# Patient Record
Sex: Female | Born: 1945
Health system: Southern US, Community
[De-identification: ages and names within clinical notes are randomized; demographics above are authoritative.]

## PROBLEM LIST (undated history)

## (undated) DIAGNOSIS — R001 Bradycardia, unspecified: Secondary | ICD-10-CM

## (undated) DIAGNOSIS — R002 Palpitations: Secondary | ICD-10-CM

## (undated) DIAGNOSIS — M1990 Unspecified osteoarthritis, unspecified site: Secondary | ICD-10-CM

## (undated) DIAGNOSIS — K219 Gastro-esophageal reflux disease without esophagitis: Secondary | ICD-10-CM

## (undated) DIAGNOSIS — E785 Hyperlipidemia, unspecified: Secondary | ICD-10-CM

## (undated) DIAGNOSIS — K449 Diaphragmatic hernia without obstruction or gangrene: Secondary | ICD-10-CM

## (undated) DIAGNOSIS — N83202 Unspecified ovarian cyst, left side: Secondary | ICD-10-CM

## (undated) DIAGNOSIS — R7303 Prediabetes: Secondary | ICD-10-CM

## (undated) DIAGNOSIS — R61 Generalized hyperhidrosis: Secondary | ICD-10-CM

## (undated) DIAGNOSIS — M2550 Pain in unspecified joint: Secondary | ICD-10-CM

## (undated) HISTORY — DX: Prediabetes: R73.03

## (undated) HISTORY — DX: Palpitations: R00.2

## (undated) HISTORY — DX: Pain in unspecified joint: M25.50

## (undated) HISTORY — DX: Unspecified osteoarthritis, unspecified site: M19.90

## (undated) HISTORY — DX: Generalized hyperhidrosis: R61

## (undated) HISTORY — DX: Diaphragmatic hernia without obstruction or gangrene: K44.9

## (undated) HISTORY — PX: ABDOMINAL HYSTERECTOMY: SHX81

## (undated) HISTORY — DX: Unspecified ovarian cyst, left side: N83.202

## (undated) HISTORY — DX: Bradycardia, unspecified: R00.1

## (undated) HISTORY — PX: COSMETIC SURGERY: SHX468

## (undated) HISTORY — DX: Hyperlipidemia, unspecified: E78.5

## (undated) HISTORY — DX: Gastro-esophageal reflux disease without esophagitis: K21.9

---

## 2000-07-26 ENCOUNTER — Other Ambulatory Visit: Admission: RE | Admit: 2000-07-26 | Discharge: 2000-07-26 | Payer: Self-pay | Admitting: Obstetrics and Gynecology

## 2001-08-29 ENCOUNTER — Other Ambulatory Visit: Admission: RE | Admit: 2001-08-29 | Discharge: 2001-08-29 | Payer: Self-pay | Admitting: Obstetrics and Gynecology

## 2002-01-23 ENCOUNTER — Ambulatory Visit (HOSPITAL_COMMUNITY): Admission: RE | Admit: 2002-01-23 | Discharge: 2002-01-23 | Payer: Self-pay | Admitting: Internal Medicine

## 2002-08-21 ENCOUNTER — Other Ambulatory Visit: Admission: RE | Admit: 2002-08-21 | Discharge: 2002-08-21 | Payer: Self-pay | Admitting: Obstetrics and Gynecology

## 2003-08-27 ENCOUNTER — Other Ambulatory Visit: Admission: RE | Admit: 2003-08-27 | Discharge: 2003-08-27 | Payer: Self-pay | Admitting: Obstetrics and Gynecology

## 2005-10-26 ENCOUNTER — Other Ambulatory Visit: Admission: RE | Admit: 2005-10-26 | Discharge: 2005-10-26 | Payer: Self-pay | Admitting: Obstetrics and Gynecology

## 2006-09-10 ENCOUNTER — Encounter: Admission: RE | Admit: 2006-09-10 | Discharge: 2006-09-10 | Payer: Self-pay | Admitting: Family Medicine

## 2006-10-11 ENCOUNTER — Encounter: Admission: RE | Admit: 2006-10-11 | Discharge: 2006-10-11 | Payer: Self-pay | Admitting: Family Medicine

## 2006-11-08 ENCOUNTER — Encounter: Admission: RE | Admit: 2006-11-08 | Discharge: 2006-11-08 | Payer: Self-pay | Admitting: Family Medicine

## 2006-12-27 ENCOUNTER — Encounter: Admission: RE | Admit: 2006-12-27 | Discharge: 2006-12-27 | Payer: Self-pay | Admitting: Family Medicine

## 2007-04-27 IMAGING — CT CT BIOPSY
1 of 3 series · 14 of 30 positions shown, 19 images · non-contrast
Comparison: none

CLINICAL DATA: 59-year-old female; status-post right L4-5 and L5-S1 facet injections 10/11/06.  The patient states she had approximately 30% relief of pain with those injections.  She returns now for right piriformis muscle injection. 
CT BIOPSY:
Operator:  Abran Szeto, M.D.
Procedure:  After obtaining informed written consent the patient was brought to the CT suite.  She was placed on the table in the prone position.  Pelvis was scanned and the right piriformis muscle was localized.  The skin was marked and then prepped and draped in the usual sterile fashion.  The superficial soft tissues were anesthetized with 1% Lidocaine.  A 22 gauge spinal needle was then directed towards the right piriformis muscle.  CT imaging confirmed appropriate trajectory.  The needle was advanced the final 2.5 cm into the piriformis muscle.  CT imaging confirmed placement within the muscle.  
The muscle was then injected with 80 mg of Depo-Medrol and 2 cc of 1% Lidocaine.  The patient did not describe any significant pain during the injection.  No immediate complications were present.  The patient was discharged in stable neurological condition.

[Series 2: routine pelvis · axial · 0.78mm/px · z∈[-10,+85]mm · 14 of 22 slices shown, 19 images]
[im 2/22  soft-tissue]
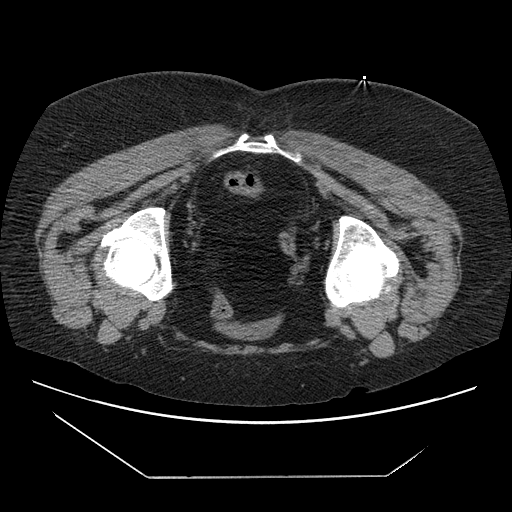
[im 2/22  bone]
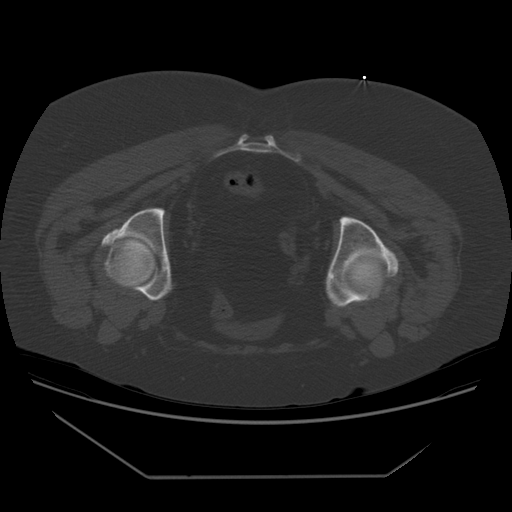
[im 4/22  soft-tissue]
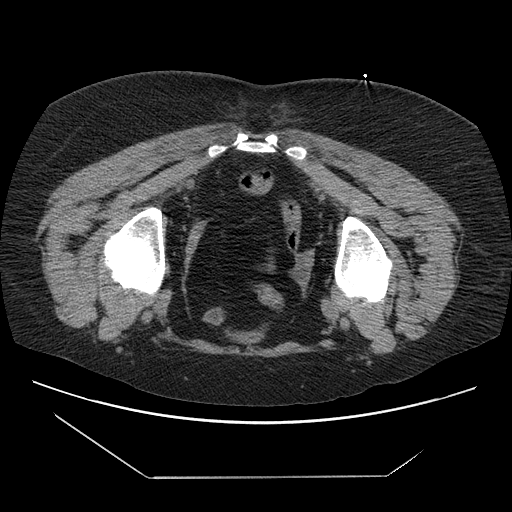
[im 5/22  soft-tissue]
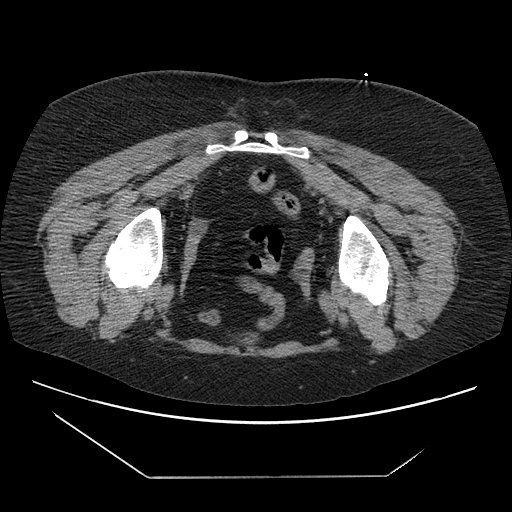
[im 7/22  soft-tissue]
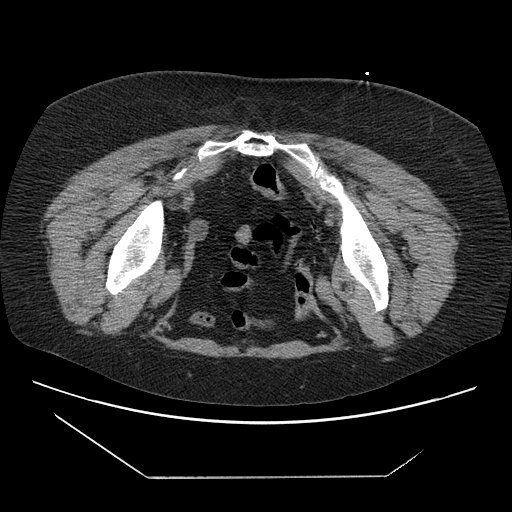
[im 8/22  soft-tissue]
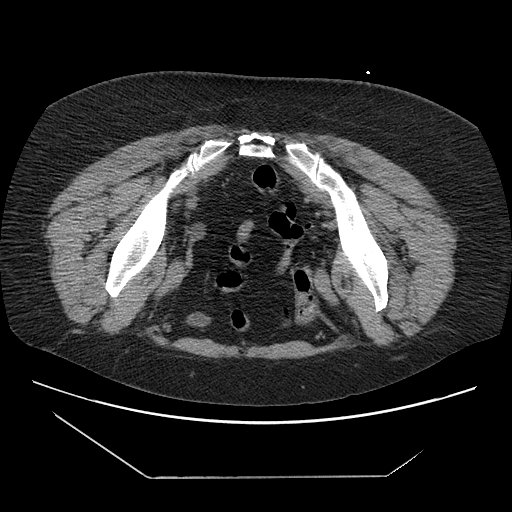
[im 10/22  soft-tissue]
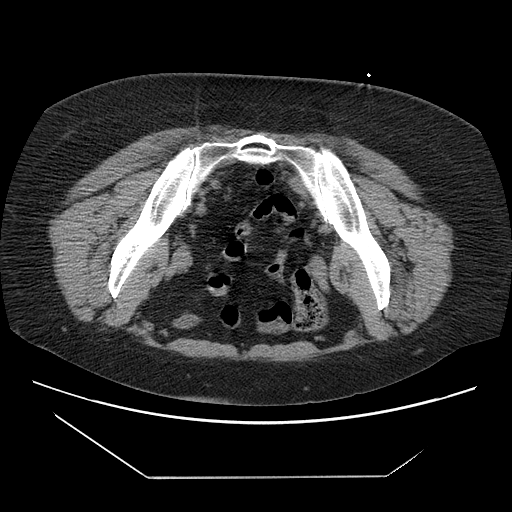
[im 12/22  soft-tissue]
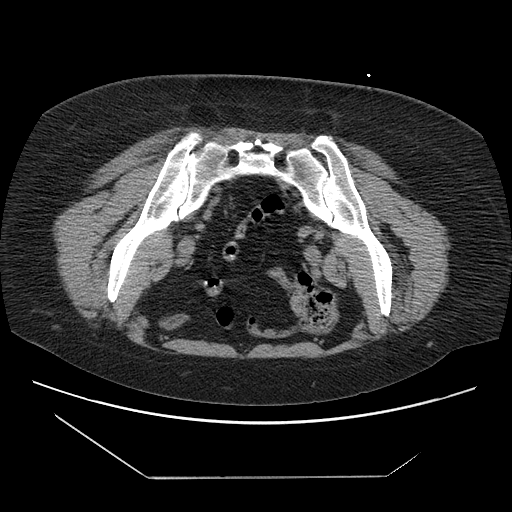
[im 13/22  soft-tissue]
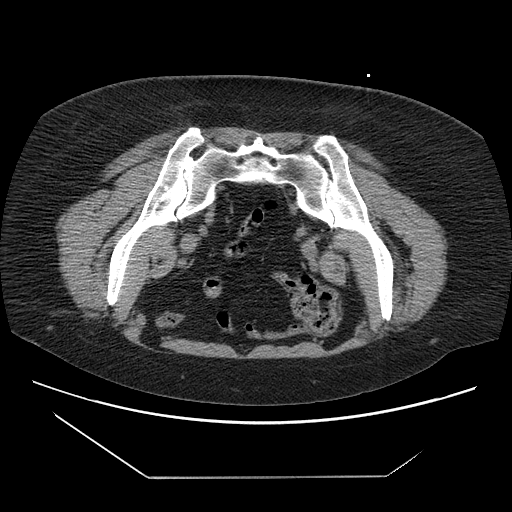
[im 15/22  soft-tissue]
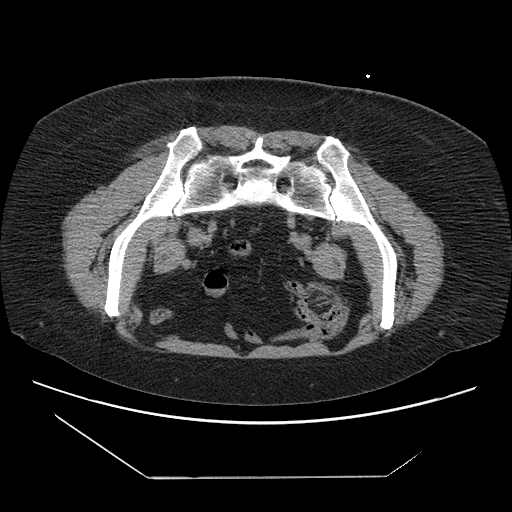
[im 15/22  bone]
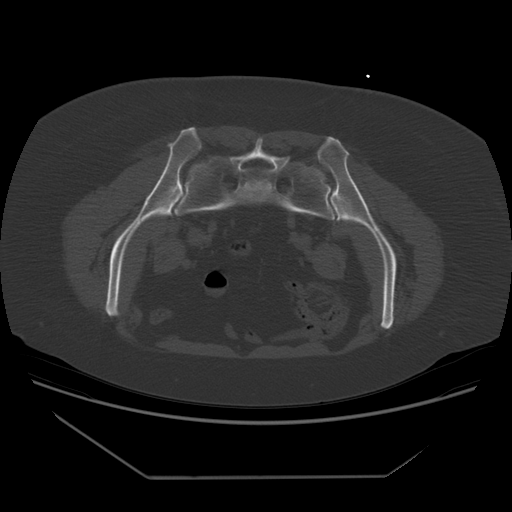
[im 16/22  soft-tissue]
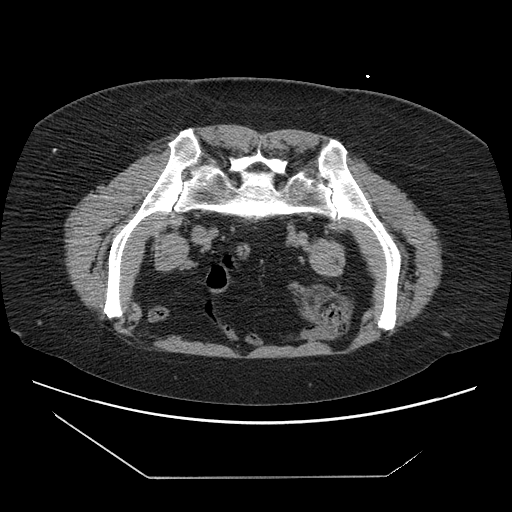
[im 18/22  soft-tissue]
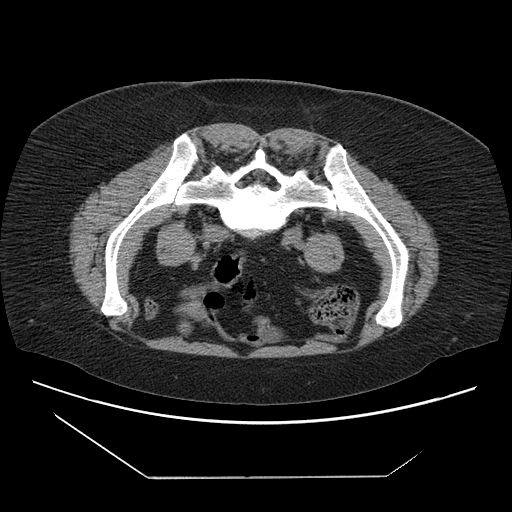
[im 18/22  lung]
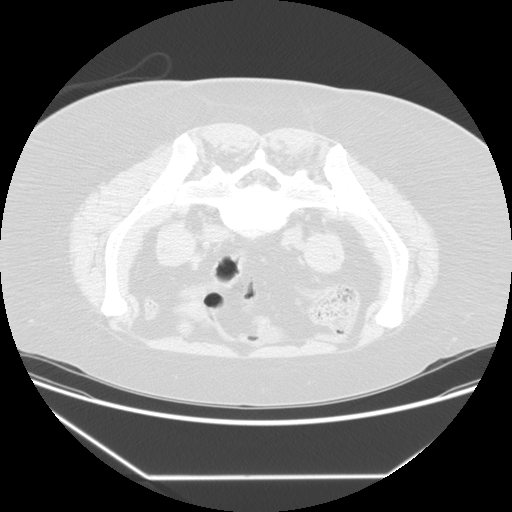
[im 19/22  soft-tissue]
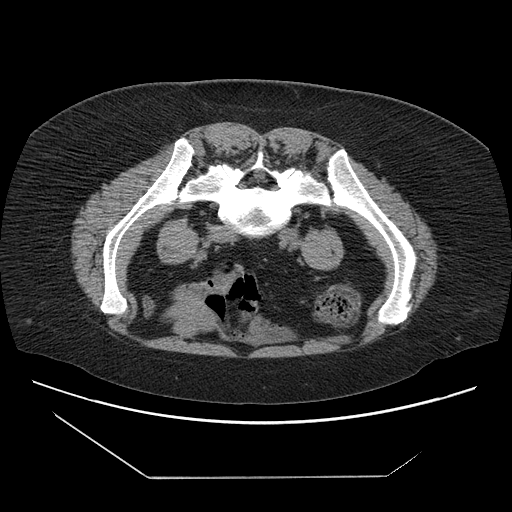
[im 19/22  lung]
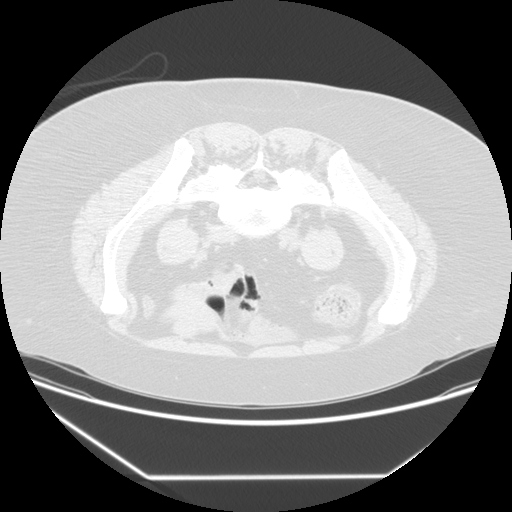
[im 20/22  lung]
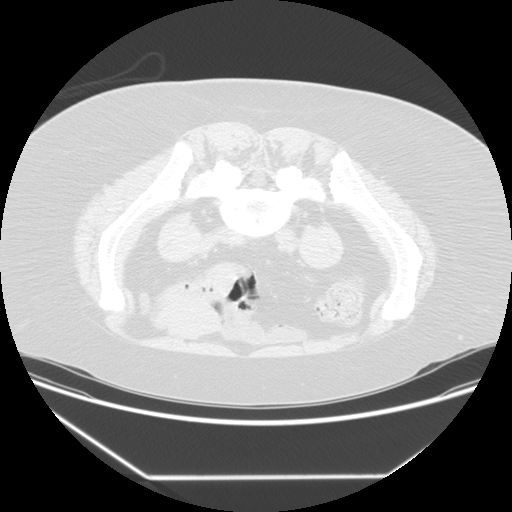
[im 21/22  soft-tissue]
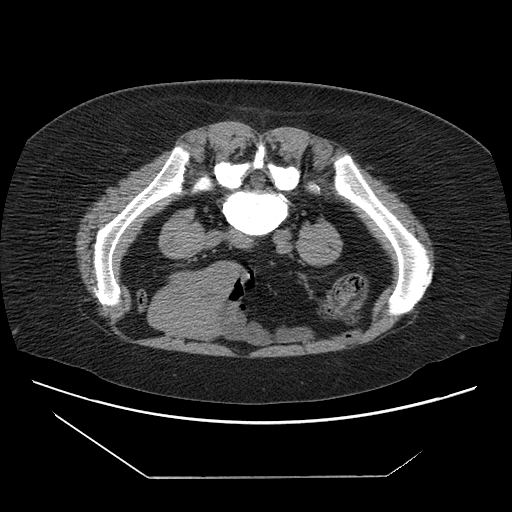
[im 21/22  lung]
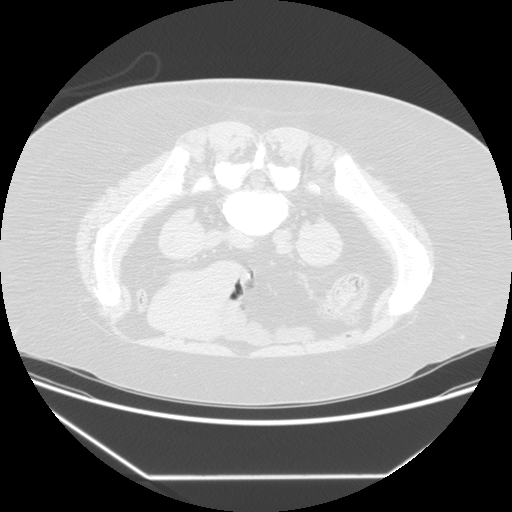

[14 of 30 positions shown; findings below may reference images not displayed]

IMPRESSION: Technically successful right piriformis steroid injection.  80 mg of Depo-Medrol were injected.

## 2012-02-05 ENCOUNTER — Encounter: Payer: Self-pay | Admitting: Internal Medicine

## 2012-05-13 ENCOUNTER — Telehealth: Payer: Self-pay | Admitting: Internal Medicine

## 2012-05-13 DIAGNOSIS — IMO0001 Reserved for inherently not codable concepts without codable children: Secondary | ICD-10-CM

## 2012-05-13 NOTE — Telephone Encounter (Signed)
Spoke with patient and she needs to have her recall colon on a Monday since that is her day off. She is willing to switch MD if Dr. Juanda Guzman cannot do it on a Monday. Please, advise. Hospital week? Another Monday? Or let patient switch MD?

## 2012-05-13 NOTE — Telephone Encounter (Signed)
Scheduled patient at [facility] endo on 05/30/12 at 10:30 AM(Jill) booking number [id number]. Pre visit 05/23/12 at 4:00 PM. Patient aware.

## 2012-05-13 NOTE — Telephone Encounter (Signed)
I can do her colonoscopy on June 17 or June 24 after 10.00 am at [facility]

## 2012-05-16 ENCOUNTER — Telehealth: Payer: Self-pay | Admitting: Internal Medicine

## 2012-05-16 NOTE — Telephone Encounter (Signed)
I have spoken with Tobi Bastos at [facility]. She has cancelled patient's scheduled colonoscopy on 05/30/12. I have also advised patient that this has been completed. She states that her insurance company will cover a colonoscopy in [place], [place] for $30 instead of $3,000.

## 2012-05-30 ENCOUNTER — Ambulatory Visit (HOSPITAL_COMMUNITY): Admit: 2012-05-30 | Payer: Self-pay | Admitting: Internal Medicine

## 2012-05-30 ENCOUNTER — Encounter (HOSPITAL_COMMUNITY): Payer: Self-pay

## 2012-05-30 SURGERY — COLONOSCOPY
Anesthesia: Moderate Sedation

## 2013-11-27 DIAGNOSIS — E7800 Pure hypercholesterolemia, unspecified: Secondary | ICD-10-CM | POA: Diagnosis not present

## 2013-11-27 DIAGNOSIS — Z23 Encounter for immunization: Secondary | ICD-10-CM | POA: Diagnosis not present

## 2013-11-27 DIAGNOSIS — R7309 Other abnormal glucose: Secondary | ICD-10-CM | POA: Diagnosis not present

## 2013-11-27 DIAGNOSIS — Z1231 Encounter for screening mammogram for malignant neoplasm of breast: Secondary | ICD-10-CM | POA: Diagnosis not present

## 2014-04-02 DIAGNOSIS — Z124 Encounter for screening for malignant neoplasm of cervix: Secondary | ICD-10-CM | POA: Diagnosis not present

## 2014-06-11 DIAGNOSIS — E7800 Pure hypercholesterolemia, unspecified: Secondary | ICD-10-CM | POA: Diagnosis not present

## 2014-06-11 DIAGNOSIS — R7309 Other abnormal glucose: Secondary | ICD-10-CM | POA: Diagnosis not present

## 2014-06-11 DIAGNOSIS — Z0000 Encounter for general adult medical examination without abnormal findings: Secondary | ICD-10-CM | POA: Diagnosis not present

## 2014-06-11 DIAGNOSIS — K219 Gastro-esophageal reflux disease without esophagitis: Secondary | ICD-10-CM | POA: Diagnosis not present

## 2014-06-11 DIAGNOSIS — N951 Menopausal and female climacteric states: Secondary | ICD-10-CM | POA: Diagnosis not present

## 2014-09-10 DIAGNOSIS — Z23 Encounter for immunization: Secondary | ICD-10-CM | POA: Diagnosis not present

## 2014-12-22 DIAGNOSIS — Z1231 Encounter for screening mammogram for malignant neoplasm of breast: Secondary | ICD-10-CM | POA: Diagnosis not present

## 2014-12-31 DIAGNOSIS — R7309 Other abnormal glucose: Secondary | ICD-10-CM | POA: Diagnosis not present

## 2015-01-07 DIAGNOSIS — R739 Hyperglycemia, unspecified: Secondary | ICD-10-CM | POA: Diagnosis not present

## 2015-01-07 DIAGNOSIS — Z23 Encounter for immunization: Secondary | ICD-10-CM | POA: Diagnosis not present

## 2015-01-07 DIAGNOSIS — R0789 Other chest pain: Secondary | ICD-10-CM | POA: Diagnosis not present

## 2015-04-22 DIAGNOSIS — Z01419 Encounter for gynecological examination (general) (routine) without abnormal findings: Secondary | ICD-10-CM | POA: Diagnosis not present

## 2015-04-22 DIAGNOSIS — N951 Menopausal and female climacteric states: Secondary | ICD-10-CM | POA: Diagnosis not present

## 2015-04-22 DIAGNOSIS — Z6827 Body mass index (BMI) 27.0-27.9, adult: Secondary | ICD-10-CM | POA: Diagnosis not present

## 2015-06-24 DIAGNOSIS — K219 Gastro-esophageal reflux disease without esophagitis: Secondary | ICD-10-CM | POA: Diagnosis not present

## 2015-06-24 DIAGNOSIS — N951 Menopausal and female climacteric states: Secondary | ICD-10-CM | POA: Diagnosis not present

## 2015-06-24 DIAGNOSIS — R739 Hyperglycemia, unspecified: Secondary | ICD-10-CM | POA: Diagnosis not present

## 2015-06-24 DIAGNOSIS — E780 Pure hypercholesterolemia: Secondary | ICD-10-CM | POA: Diagnosis not present

## 2015-06-24 DIAGNOSIS — Z0000 Encounter for general adult medical examination without abnormal findings: Secondary | ICD-10-CM | POA: Diagnosis not present

## 2015-07-04 ENCOUNTER — Ambulatory Visit (INDEPENDENT_AMBULATORY_CARE_PROVIDER_SITE_OTHER): Payer: Medicare Other | Admitting: Physician Assistant

## 2015-07-04 ENCOUNTER — Ambulatory Visit: Payer: Medicare Other

## 2015-07-04 VITALS — BP 136/84 | HR 80 | Temp 98.8°F | Resp 18 | Ht 66.0 in | Wt 217.8 lb

## 2015-07-04 DIAGNOSIS — J029 Acute pharyngitis, unspecified: Secondary | ICD-10-CM | POA: Diagnosis not present

## 2015-07-04 DIAGNOSIS — R0981 Nasal congestion: Secondary | ICD-10-CM

## 2015-07-04 LAB — POCT RAPID STREP A (OFFICE): RAPID STREP A SCREEN: NEGATIVE

## 2015-07-04 MED ORDER — TRAMADOL-ACETAMINOPHEN 37.5-325 MG PO TABS: 1.0000 | ORAL_TABLET | Freq: Four times a day (QID) | ORAL | Status: DC | PRN

## 2015-07-04 MED ORDER — CETIRIZINE HCL 10 MG PO TABS: 10.0000 mg | ORAL_TABLET | Freq: Once | ORAL | Status: AC

## 2015-07-04 NOTE — Progress Notes (Signed)
07/04/2015 at 9:00 PM  Abigail Guzman / DOB: [birth year] / MRN: [id number]  The patient  does not have a problem list on file.  SUBJECTIVE  Chief complaint: Sore Throat; Headache; and Ear Fullness  Abigail Guzman is a well appearing 69 y.o. female here today for sore throat 7 days in duration.  She describes the throat pain as scratchy and dry, the pain is moderate and worse with swallowing.  She has tried an allergy tab and this helped some.  She associated ear fullness, HA and nasal congestion.  Denies fever, chills, nausea, cough, chest pain, SOB, dizziness. She take Etodolac daily for a history of back pain.   She  has no past medical history on file.    Medications reviewed and updated by myself where necessary, and exist elsewhere in the encounter.   Abigail Guzman is allergic to sulfa antibiotics. She  reports that she has never smoked. She does not have any smokeless tobacco history on file. She  has no sexual activity history on file. The patient  has past surgical history that includes Cosmetic surgery and Abdominal hysterectomy.  Her family history includes Diabetes in her father; Heart disease in her father; Stroke in her mother.  Review of Systems  Cardiovascular: Negative for chest pain.  Musculoskeletal: Negative for myalgias.  Skin: Negative for rash.  Psychiatric/Behavioral: Negative for depression.    Per HPI unless otherwise stated.    OBJECTIVE  Her  height is 5\' 6"  (1.676 m) and weight is 217 lb 12.8 oz (98.793 kg). Her oral temperature is 98.8 F (37.1 C). Her blood pressure is 136/84 and her pulse is 80. Her respiration is 18 and oxygen saturation is 93%.  The patient's body mass index is 35.17 kg/(m^2).  Physical Exam  Constitutional: She is oriented to person, place, and time. She appears well-developed and well-nourished. No distress.  HENT:  Right Ear: Hearing, tympanic membrane, external ear and ear canal normal.  Left Ear: Hearing, tympanic  membrane, external ear and ear canal normal.  Nose: Mucosal edema present. Right sinus exhibits no maxillary sinus tenderness and no frontal sinus tenderness. Left sinus exhibits no maxillary sinus tenderness and no frontal sinus tenderness.  Mouth/Throat: Uvula is midline, oropharynx is clear and moist and mucous membranes are normal.  Cardiovascular: Normal rate, regular rhythm and normal heart sounds.   Respiratory: Effort normal and breath sounds normal. She has no wheezes. She has no rales.  Neurological: She is alert and oriented to person, place, and time.  Skin: Skin is warm and dry. She is not diaphoretic.  Psychiatric: She has a normal mood and affect.    Results for orders placed or performed in visit on 07/04/15 (from the past 24 hour(s))  POCT rapid strep A     Status: Normal   Collection Time: 07/04/15  8:55 PM  Result Value Ref Range   Rapid Strep A Screen Negative Negative    ASSESSMENT & PLAN  Abigail Guzman was seen today for sore throat, headache and ear fullness.  Diagnoses and all orders for this visit:  Sore throat: Work up negative thus far and her exam is not concerning. Throat culture pending.  Advised that patient come back to clinic in 7 days if her pain has not resolved by that time in order to consider a more broad differential and possible imaging.  Avoiding NSAID for pain at this time as patient is already taking Etodolac.   Orders: -     POCT  rapid strep A -     Culture, Group A Strep -     traMADol-acetaminophen (ULTRACET) 37.5-325 MG per tablet; Take 1 tablet by mouth every 6 (six) hours as needed.  Nasal congestion Orders: -     cetirizine (ZYRTEC) tablet 10 mg; Take 1 tablet (10 mg total) by mouth once.    The patient was advised to call or come back to clinic if she does not see an improvement in symptoms, or worsens with the above plan.   Philis Fendt, MHS, PA-C [facility] 07/04/2015 9:00  PM

## 2015-07-06 LAB — CULTURE, GROUP A STREP: ORGANISM ID, BACTERIA: NORMAL

## 2015-08-01 NOTE — Progress Notes (Signed)
  Medical screening examination/treatment/procedure(s) were performed by non-physician practitioner and as supervising physician I was immediately available for consultation/collaboration.     

## 2015-08-08 ENCOUNTER — Encounter: Payer: Self-pay | Admitting: Gastroenterology

## 2015-08-08 ENCOUNTER — Encounter: Payer: Self-pay | Admitting: Internal Medicine

## 2015-09-30 DIAGNOSIS — Z23 Encounter for immunization: Secondary | ICD-10-CM | POA: Diagnosis not present

## 2015-09-30 DIAGNOSIS — R7301 Impaired fasting glucose: Secondary | ICD-10-CM | POA: Diagnosis not present

## 2015-12-30 DIAGNOSIS — E7800 Pure hypercholesterolemia, unspecified: Secondary | ICD-10-CM | POA: Diagnosis not present

## 2016-01-20 DIAGNOSIS — Z1231 Encounter for screening mammogram for malignant neoplasm of breast: Secondary | ICD-10-CM | POA: Diagnosis not present

## 2016-02-24 DIAGNOSIS — H2513 Age-related nuclear cataract, bilateral: Secondary | ICD-10-CM | POA: Diagnosis not present

## 2016-05-04 DIAGNOSIS — Z01419 Encounter for gynecological examination (general) (routine) without abnormal findings: Secondary | ICD-10-CM | POA: Diagnosis not present

## 2016-05-04 DIAGNOSIS — Z6831 Body mass index (BMI) 31.0-31.9, adult: Secondary | ICD-10-CM | POA: Diagnosis not present

## 2016-05-18 DIAGNOSIS — K219 Gastro-esophageal reflux disease without esophagitis: Secondary | ICD-10-CM | POA: Diagnosis not present

## 2016-05-18 DIAGNOSIS — K2270 Barrett's esophagus without dysplasia: Secondary | ICD-10-CM | POA: Diagnosis not present

## 2016-06-22 DIAGNOSIS — Z01818 Encounter for other preprocedural examination: Secondary | ICD-10-CM | POA: Diagnosis not present

## 2016-06-22 DIAGNOSIS — K2270 Barrett's esophagus without dysplasia: Secondary | ICD-10-CM | POA: Diagnosis not present

## 2016-06-22 DIAGNOSIS — A048 Other specified bacterial intestinal infections: Secondary | ICD-10-CM | POA: Diagnosis not present

## 2016-06-28 DIAGNOSIS — K5281 Eosinophilic gastritis or gastroenteritis: Secondary | ICD-10-CM | POA: Diagnosis not present

## 2016-06-28 DIAGNOSIS — K2270 Barrett's esophagus without dysplasia: Secondary | ICD-10-CM | POA: Diagnosis not present

## 2016-06-28 DIAGNOSIS — A048 Other specified bacterial intestinal infections: Secondary | ICD-10-CM | POA: Diagnosis not present

## 2016-07-06 DIAGNOSIS — K2970 Gastritis, unspecified, without bleeding: Secondary | ICD-10-CM | POA: Diagnosis not present

## 2016-07-06 DIAGNOSIS — K219 Gastro-esophageal reflux disease without esophagitis: Secondary | ICD-10-CM | POA: Diagnosis not present

## 2016-07-20 DIAGNOSIS — Z0000 Encounter for general adult medical examination without abnormal findings: Secondary | ICD-10-CM | POA: Diagnosis not present

## 2016-07-20 DIAGNOSIS — K219 Gastro-esophageal reflux disease without esophagitis: Secondary | ICD-10-CM | POA: Diagnosis not present

## 2016-07-20 DIAGNOSIS — R739 Hyperglycemia, unspecified: Secondary | ICD-10-CM | POA: Diagnosis not present

## 2016-07-20 DIAGNOSIS — R7303 Prediabetes: Secondary | ICD-10-CM | POA: Diagnosis not present

## 2016-07-20 DIAGNOSIS — R740 Nonspecific elevation of levels of transaminase and lactic acid dehydrogenase [LDH]: Secondary | ICD-10-CM | POA: Diagnosis not present

## 2016-07-20 DIAGNOSIS — Z23 Encounter for immunization: Secondary | ICD-10-CM | POA: Diagnosis not present

## 2016-07-20 DIAGNOSIS — E7800 Pure hypercholesterolemia, unspecified: Secondary | ICD-10-CM | POA: Diagnosis not present

## 2016-07-20 DIAGNOSIS — E669 Obesity, unspecified: Secondary | ICD-10-CM | POA: Diagnosis not present

## 2016-07-20 DIAGNOSIS — N951 Menopausal and female climacteric states: Secondary | ICD-10-CM | POA: Diagnosis not present

## 2016-07-20 DIAGNOSIS — N183 Chronic kidney disease, stage 3 (moderate): Secondary | ICD-10-CM | POA: Diagnosis not present

## 2016-08-28 DIAGNOSIS — Z23 Encounter for immunization: Secondary | ICD-10-CM | POA: Diagnosis not present

## 2017-01-04 DIAGNOSIS — K219 Gastro-esophageal reflux disease without esophagitis: Secondary | ICD-10-CM | POA: Diagnosis not present

## 2017-01-20 DIAGNOSIS — Z1231 Encounter for screening mammogram for malignant neoplasm of breast: Secondary | ICD-10-CM | POA: Diagnosis not present

## 2017-01-20 DIAGNOSIS — Z780 Asymptomatic menopausal state: Secondary | ICD-10-CM | POA: Diagnosis not present

## 2017-02-05 DIAGNOSIS — N183 Chronic kidney disease, stage 3 (moderate): Secondary | ICD-10-CM | POA: Diagnosis not present

## 2017-02-05 DIAGNOSIS — E7800 Pure hypercholesterolemia, unspecified: Secondary | ICD-10-CM | POA: Diagnosis not present

## 2017-02-05 DIAGNOSIS — M25569 Pain in unspecified knee: Secondary | ICD-10-CM | POA: Diagnosis not present

## 2017-02-05 DIAGNOSIS — R7303 Prediabetes: Secondary | ICD-10-CM | POA: Diagnosis not present

## 2017-03-18 DIAGNOSIS — R350 Frequency of micturition: Secondary | ICD-10-CM | POA: Diagnosis not present

## 2017-03-18 DIAGNOSIS — R109 Unspecified abdominal pain: Secondary | ICD-10-CM | POA: Diagnosis not present

## 2017-03-18 DIAGNOSIS — K921 Melena: Secondary | ICD-10-CM | POA: Diagnosis not present

## 2017-03-22 ENCOUNTER — Other Ambulatory Visit: Payer: Self-pay | Admitting: Obstetrics and Gynecology

## 2017-03-22 DIAGNOSIS — R102 Pelvic and perineal pain: Secondary | ICD-10-CM | POA: Diagnosis not present

## 2017-03-22 DIAGNOSIS — R109 Unspecified abdominal pain: Secondary | ICD-10-CM

## 2017-03-22 DIAGNOSIS — N83291 Other ovarian cyst, right side: Secondary | ICD-10-CM | POA: Diagnosis not present

## 2017-03-22 DIAGNOSIS — N83292 Other ovarian cyst, left side: Secondary | ICD-10-CM | POA: Diagnosis not present

## 2017-03-23 ENCOUNTER — Other Ambulatory Visit: Payer: Self-pay | Admitting: Obstetrics and Gynecology

## 2017-03-23 DIAGNOSIS — R1900 Intra-abdominal and pelvic swelling, mass and lump, unspecified site: Secondary | ICD-10-CM

## 2017-03-24 ENCOUNTER — Ambulatory Visit
Admission: RE | Admit: 2017-03-24 | Discharge: 2017-03-24 | Disposition: A | Discharge status: Home / Self Care | Payer: Medicare Other | Source: Ambulatory Visit | Attending: Obstetrics and Gynecology | Admitting: Obstetrics and Gynecology

## 2017-03-24 DIAGNOSIS — R1900 Intra-abdominal and pelvic swelling, mass and lump, unspecified site: Secondary | ICD-10-CM

## 2017-03-24 DIAGNOSIS — R1030 Lower abdominal pain, unspecified: Secondary | ICD-10-CM | POA: Diagnosis not present

## 2017-03-24 MED ORDER — IOPAMIDOL (ISOVUE-300) INJECTION 61%
100.0000 mL | Freq: Once | INTRAVENOUS | Status: AC | PRN
  Administered 2017-03-24: 100 mL via INTRAVENOUS

## 2017-03-25 DIAGNOSIS — N83292 Other ovarian cyst, left side: Secondary | ICD-10-CM | POA: Diagnosis not present

## 2017-03-25 DIAGNOSIS — N83209 Unspecified ovarian cyst, unspecified side: Secondary | ICD-10-CM | POA: Diagnosis not present

## 2017-03-31 ENCOUNTER — Telehealth: Payer: Self-pay | Admitting: *Deleted

## 2017-03-31 NOTE — Telephone Encounter (Signed)
I have called and gave the patient a date/time for the appt on April 30th. Patient request to be put on waiting list for early appt

## 2017-04-12 ENCOUNTER — Ambulatory Visit: Payer: Medicare Other | Attending: Gynecologic Oncology | Admitting: Gynecologic Oncology

## 2017-04-12 ENCOUNTER — Encounter: Payer: Self-pay | Admitting: Gynecologic Oncology

## 2017-04-12 VITALS — BP 123/70 | HR 56 | Temp 97.7°F | Resp 18 | Ht 65.91 in | Wt 182.3 lb

## 2017-04-12 DIAGNOSIS — R102 Pelvic and perineal pain: Secondary | ICD-10-CM | POA: Diagnosis not present

## 2017-04-12 DIAGNOSIS — N839 Noninflammatory disorder of ovary, fallopian tube and broad ligament, unspecified: Secondary | ICD-10-CM

## 2017-04-12 DIAGNOSIS — N83209 Unspecified ovarian cyst, unspecified side: Secondary | ICD-10-CM | POA: Diagnosis not present

## 2017-04-12 DIAGNOSIS — R109 Unspecified abdominal pain: Secondary | ICD-10-CM

## 2017-04-12 DIAGNOSIS — Z87891 Personal history of nicotine dependence: Secondary | ICD-10-CM | POA: Insufficient documentation

## 2017-04-12 DIAGNOSIS — N83202 Unspecified ovarian cyst, left side: Secondary | ICD-10-CM | POA: Insufficient documentation

## 2017-04-12 DIAGNOSIS — Z8742 Personal history of other diseases of the female genital tract: Secondary | ICD-10-CM | POA: Diagnosis not present

## 2017-04-12 DIAGNOSIS — N951 Menopausal and female climacteric states: Secondary | ICD-10-CM

## 2017-04-12 DIAGNOSIS — Z90710 Acquired absence of both cervix and uterus: Secondary | ICD-10-CM

## 2017-04-12 DIAGNOSIS — Z9079 Acquired absence of other genital organ(s): Secondary | ICD-10-CM | POA: Insufficient documentation

## 2017-04-12 HISTORY — DX: Unspecified ovarian cyst, left side: N83.202

## 2017-04-12 NOTE — Patient Instructions (Signed)
Please discontinue your estrogen patch.  We will re-evaluate your symptoms in 2 months.  If your symptoms persist we will consider performing surgery at that time.  You should be evaluated by your gastroenterologist in the mean time.

## 2017-04-12 NOTE — Progress Notes (Signed)
Consult Note: Gyn-Onc  Consult was requested by Dr. Radene Knee for the evaluation of Abigail Guzman 71 y.o. female  CC:  Chief Complaint  Patient presents with  . Ovarian Cystic Mass    Assessment/Plan:  Ms. Abigail Guzman  is a 71 y.o.  year old with a left ovarian mass, likely adherent to vagina, possibly with residual endometriosis.  I am recommending a trial of stopping her estradiol. If this improves her symptoms from the mass, then she might be able to avoid a surgery.  We will re-evaluate her symptoms in 2 months. If the pain is persistent, then we will attempt a robotic assisted BSO with upper vaginectomy to resect the mass.   HPI: Abigail Guzman is a 71 year old G0 who is seen in consultation at the request of Dr Radene Knee for a left sided ovarian mass and pelvic pain.  The patient has a remote history of endometriosis and a history of an ex lap and hysterectomy many years ago for adenomyosis and endometriosis. She states that this immediately resolved her symptoms of cyclic menstrual pain.  However, since January, 2018 she has had intermittent severe pains in the pelvis and mid abdomen and a feeling of heavy pressure low in the pelvis.  She has an appointment scheduled to see a GI doctor. She thinks that maybe her stools may be a little darker than usual.  She also endorses occasional brown vaginal discharge.   She denies bright red rectal bleeding.   Her prior surgical history is significant for for the hysterectomy and a laparoscopy following this.    She takes estradiol for hot flashes  CA 125 on 03/25/17 was normal at 19  [place] on 03/22/17 showed a left ovary with a simple appearing cyst measuring 1.8x1.8cm and a right ovary with a simple appearing cyst measuring 2.2x1.9cm   CT abdo/pelvis on 03/24/17 showed a 2.2x5.6cm fluid density lesion in the left adnexa with peripheral nodular components.  Current Meds:  Outpatient Encounter Prescriptions as of 04/12/2017   Medication Sig  . estradiol (CLIMARA - DOSED IN MG/24 HR) 0.0375 mg/24hr patch Place 0.0375 mg onto the skin once a week.  . etodolac (LODINE) 400 MG tablet Take 400 mg by mouth 2 (two) times daily.  . hyoscyamine (LEVSIN SL) 0.125 MG SL tablet   . omeprazole (PRILOSEC) 40 MG capsule Take 40 mg by mouth daily.  Marland Kitchen oxyCODONE-acetaminophen (PERCOCET) 7.5-325 MG tablet   . simvastatin (ZOCOR) 40 MG tablet Take 40 mg by mouth daily.  Marland Kitchen triamterene-hydrochlorothiazide (DYAZIDE) 37.5-25 MG per capsule Take 1 capsule by mouth daily.  . [DISCONTINUED] traMADol-acetaminophen (ULTRACET) 37.5-325 MG per tablet Take 1 tablet by mouth every 6 (six) hours as needed.   Facility-Administered Encounter Medications as of 04/12/2017  Medication  . cetirizine (ZYRTEC) tablet 10 mg    Allergy:  Allergies  Allergen Reactions  . Sulfa Antibiotics Rash    Social Hx:   Social History   Social History  . Marital status: Single    Spouse name: N/A  . Number of children: N/A  . Years of education: N/A   Occupational History  . Not on file.   Social History Main Topics  . Smoking status: Former Smoker    Packs/day: 1.00    Years: 10.00    Types: Cigarettes    Quit date: 04/13/1979  . Smokeless tobacco: Never Used  . Alcohol use 1.2 oz/week    2 Glasses of wine per week  . Drug use: Yes  Types: Marijuana  . Sexual activity: Not on file   Other Topics Concern  . Not on file   Social History Narrative  . No narrative on file    Past Surgical Hx:  Past Surgical History:  Procedure Laterality Date  . ABDOMINAL HYSTERECTOMY    . COSMETIC SURGERY      Past Medical Hx: History reviewed. No pertinent past medical history.  Past Gynecological History:  Adenomyosis and endometriosis (s/p hysterectomy) No LMP recorded. Patient has had a hysterectomy.  Family Hx:  Family History  Problem Relation Age of Onset  . Stroke Mother   . Heart disease Father   . Diabetes Father     Review of  Systems:  Constitutional  Feels well,    ENT Normal appearing ears and nares bilaterally Skin/Breast  No rash, sores, jaundice, itching, dryness Cardiovascular  No chest pain, shortness of breath, or edema  Pulmonary  No cough or wheeze.  Gastro Intestinal  No nausea, vomitting, or diarrhoea. No bright red blood per rectum, no change in bowel movement, or constipation.  Genito Urinary  No frequency, urgency, dysuria, + abdominal pains + occasional vaginal discharge Musculo Skeletal  No myalgia, arthralgia, joint swelling or pain  Neurologic  No weakness, numbness, change in gait,  Psychology  No depression, anxiety, insomnia.   Vitals:  Blood pressure 123/70, pulse (!) 56, temperature 97.7 F (36.5 C), temperature source Oral, resp. rate 18, height 5' 5.91" (1.674 m), weight 182 lb 4.8 oz (82.7 kg).  Physical Exam: WD in NAD Neck  Supple NROM, without any enlargements.  Lymph Node Survey No cervical supraclavicular or inguinal adenopathy Cardiovascular  Pulse normal rate, regularity and rhythm. S1 and S2 normal.  Lungs  Clear to auscultation bilateraly, without wheezes/crackles/rhonchi. Good air movement.  Skin  No rash/lesions/breakdown  Psychiatry  Alert and oriented to person, place, and time  Abdomen  Normoactive bowel sounds, abdomen soft, non-tender and overweight without evidence of hernia.  Back No CVA tenderness Genito Urinary  Vulva/vagina: Normal external female genitalia.  No lesions. No discharge or bleeding.  Bladder/urethra:  No lesions or masses, well supported bladder  Vagina: normal in appearance at the cuff- no bleeding/nodules/lesions or discharge. There is a palpable 4cm mass at the vaginal cuff that is mobile but adherent to the vagina. It is not appreciated well on RV exam.  Adnexa: see vaginal exam.  Rectal  Good tone, no masses no cul de sac nodularity.  Extremities  No bilateral cyanosis, clubbing or edema.   Donaciano Eva, MD   04/12/2017, 5:39 PM

## 2017-04-20 DIAGNOSIS — R194 Change in bowel habit: Secondary | ICD-10-CM | POA: Diagnosis not present

## 2017-04-20 DIAGNOSIS — R109 Unspecified abdominal pain: Secondary | ICD-10-CM | POA: Diagnosis not present

## 2017-04-20 DIAGNOSIS — K219 Gastro-esophageal reflux disease without esophagitis: Secondary | ICD-10-CM | POA: Diagnosis not present

## 2017-04-20 DIAGNOSIS — R195 Other fecal abnormalities: Secondary | ICD-10-CM | POA: Diagnosis not present

## 2017-05-06 DIAGNOSIS — K640 First degree hemorrhoids: Secondary | ICD-10-CM | POA: Diagnosis not present

## 2017-05-06 DIAGNOSIS — Z780 Asymptomatic menopausal state: Secondary | ICD-10-CM | POA: Diagnosis not present

## 2017-05-06 DIAGNOSIS — Z01419 Encounter for gynecological examination (general) (routine) without abnormal findings: Secondary | ICD-10-CM | POA: Diagnosis not present

## 2017-05-06 DIAGNOSIS — Z6828 Body mass index (BMI) 28.0-28.9, adult: Secondary | ICD-10-CM | POA: Diagnosis not present

## 2017-05-19 DIAGNOSIS — K449 Diaphragmatic hernia without obstruction or gangrene: Secondary | ICD-10-CM | POA: Diagnosis not present

## 2017-05-19 DIAGNOSIS — K2930 Chronic superficial gastritis without bleeding: Secondary | ICD-10-CM | POA: Diagnosis not present

## 2017-05-19 DIAGNOSIS — Q438 Other specified congenital malformations of intestine: Secondary | ICD-10-CM | POA: Diagnosis not present

## 2017-05-19 DIAGNOSIS — K644 Residual hemorrhoidal skin tags: Secondary | ICD-10-CM | POA: Diagnosis not present

## 2017-05-19 DIAGNOSIS — K641 Second degree hemorrhoids: Secondary | ICD-10-CM | POA: Diagnosis not present

## 2017-05-19 DIAGNOSIS — K598 Other specified functional intestinal disorders: Secondary | ICD-10-CM | POA: Diagnosis not present

## 2017-05-19 DIAGNOSIS — K317 Polyp of stomach and duodenum: Secondary | ICD-10-CM | POA: Diagnosis not present

## 2017-05-19 DIAGNOSIS — R195 Other fecal abnormalities: Secondary | ICD-10-CM | POA: Diagnosis not present

## 2017-05-25 DIAGNOSIS — K2930 Chronic superficial gastritis without bleeding: Secondary | ICD-10-CM | POA: Diagnosis not present

## 2017-05-25 DIAGNOSIS — K317 Polyp of stomach and duodenum: Secondary | ICD-10-CM | POA: Diagnosis not present

## 2017-06-07 ENCOUNTER — Encounter: Payer: Self-pay | Admitting: Gynecologic Oncology

## 2017-06-07 ENCOUNTER — Ambulatory Visit: Payer: Medicare Other | Attending: Gynecologic Oncology | Admitting: Gynecologic Oncology

## 2017-06-07 VITALS — BP 121/73 | HR 56 | Temp 98.3°F | Resp 18 | Wt 187.0 lb

## 2017-06-07 DIAGNOSIS — Z87891 Personal history of nicotine dependence: Secondary | ICD-10-CM | POA: Diagnosis not present

## 2017-06-07 DIAGNOSIS — N83202 Unspecified ovarian cyst, left side: Secondary | ICD-10-CM | POA: Diagnosis not present

## 2017-06-07 DIAGNOSIS — N83209 Unspecified ovarian cyst, unspecified side: Secondary | ICD-10-CM | POA: Diagnosis present

## 2017-06-07 DIAGNOSIS — N83201 Unspecified ovarian cyst, right side: Secondary | ICD-10-CM | POA: Diagnosis not present

## 2017-06-07 DIAGNOSIS — N800 Endometriosis of uterus: Secondary | ICD-10-CM | POA: Diagnosis not present

## 2017-06-07 DIAGNOSIS — R102 Pelvic and perineal pain: Secondary | ICD-10-CM | POA: Diagnosis not present

## 2017-06-07 DIAGNOSIS — Z90710 Acquired absence of both cervix and uterus: Secondary | ICD-10-CM | POA: Insufficient documentation

## 2017-06-07 NOTE — Progress Notes (Signed)
Consult Note: Gyn-Onc  Consult was requested by Dr. Radene Knee for the evaluation of Abigail Guzman 71 y.o. female  CC:  Chief Complaint  Patient presents with  . Ovarian Cyst    Assessment/Plan:  Abigail Guzman  is a 71 y.o.  year old with a left ovarian mass, likely adherent to vagina, possibly with residual endometriosis.  Symptoms significantly improved with a trial of stopping her estradiol. I revisited the role of surgery - I have a low suspicion for occult malignancy, and if her symptoms are controlled off estrogen, this might avoid a surgery for her. I discussed that surgery was feasible (robotic assisted BSO and upper vaginectomy) but would be associated with risk of damage to ureter, bladder or rectum.   She would like to hold off on surgery and we will re-evaluate her symptoms in 2 months after a repeat CT scan (as on exam I can no longer appreciate the mass). If the pain is persistent and worse again, then we will attempt a robotic assisted BSO with upper vaginectomy to resect the mass.  HPI: Abigail Guzman is a 71 year old G0 who is seen in consultation at the request of Dr Radene Knee for a left sided ovarian mass and pelvic pain.  The patient has a remote history of endometriosis and a history of an ex lap and hysterectomy many years ago for adenomyosis and endometriosis. She states that this immediately resolved her symptoms of cyclic menstrual pain.  However, since January, 2018 she has had intermittent severe pains in the pelvis and mid abdomen and a feeling of heavy pressure low in the pelvis.  She has an appointment scheduled to see a GI doctor. She thinks that maybe her stools may be a little darker than usual.  She also endorses occasional brown vaginal discharge.   She denies bright red rectal bleeding.   Her prior surgical history is significant for for the hysterectomy and a laparoscopy following this.    She takes estradiol for hot flashes  CA 125 on  03/25/17 was normal at 19  [place] on 03/22/17 showed a left ovary with a simple appearing cyst measuring 1.8x1.8cm and a right ovary with a simple appearing cyst measuring 2.2x1.9cm   CT abdo/pelvis on 03/24/17 showed a 2.2x5.6cm fluid density lesion in the left adnexa with peripheral nodular components.  Interval Hx: The patient was reluctant for surgery unless absolutely necessary as she understood the risks invovled with a BSO and upper vaginectomy. She elected for a trial of estrogen withdrawan and we discontinued her estradiol for 2 months. During that time she experienced a decrease in pain symptoms by 60% ("maybe more").  She underwent colonoscopy which revealed no extrinsic compression or intraluminal lesions.  Current Meds:  Outpatient Encounter Prescriptions as of 06/07/2017  Medication Sig  . etodolac (LODINE) 400 MG tablet Take 400 mg by mouth 2 (two) times daily.  . hyoscyamine (LEVSIN SL) 0.125 MG SL tablet   . omeprazole (PRILOSEC) 40 MG capsule Take 40 mg by mouth daily.  . simvastatin (ZOCOR) 40 MG tablet Take 40 mg by mouth daily.  Marland Kitchen triamterene-hydrochlorothiazide (DYAZIDE) 37.5-25 MG per capsule Take 1 capsule by mouth daily.  . [DISCONTINUED] estradiol (CLIMARA - DOSED IN MG/24 HR) 0.0375 mg/24hr patch Place 0.0375 mg onto the skin once a week.  . [DISCONTINUED] oxyCODONE-acetaminophen (PERCOCET) 7.5-325 MG tablet    Facility-Administered Encounter Medications as of 06/07/2017  Medication  . cetirizine (ZYRTEC) tablet 10 mg    Allergy:  Allergies  Allergen Reactions  . Sulfa Antibiotics Rash    Social Hx:   Social History   Social History  . Marital status: Single    Spouse name: N/A  . Number of children: N/A  . Years of education: N/A   Occupational History  . Not on file.   Social History Main Topics  . Smoking status: Former Smoker    Packs/day: 1.00    Years: 10.00    Types: Cigarettes    Quit date: 04/13/1979  . Smokeless tobacco: Never Used  .  Alcohol use 1.2 oz/week    2 Glasses of wine per week  . Drug use: Yes    Types: Marijuana  . Sexual activity: Not on file   Other Topics Concern  . Not on file   Social History Narrative  . No narrative on file    Past Surgical Hx:  Past Surgical History:  Procedure Laterality Date  . ABDOMINAL HYSTERECTOMY    . COSMETIC SURGERY      Past Medical Hx: History reviewed. No pertinent past medical history.  Past Gynecological History:  Adenomyosis and endometriosis (s/p hysterectomy) No LMP recorded. Patient has had a hysterectomy.  Family Hx:  Family History  Problem Relation Age of Onset  . Stroke Mother   . Heart disease Father   . Diabetes Father     Review of Systems:  Constitutional  Feels well,    ENT Normal appearing ears and nares bilaterally Skin/Breast  No rash, sores, jaundice, itching, dryness Cardiovascular  No chest pain, shortness of breath, or edema  Pulmonary  No cough or wheeze.  Gastro Intestinal  No nausea, vomitting, or diarrhoea. No bright red blood per rectum, no change in bowel movement, or constipation.  Genito Urinary  No frequency, urgency, dysuria, decreased abdominal pains + occasional vaginal brown discharge Musculo Skeletal  No myalgia, arthralgia, joint swelling or pain  Neurologic  No weakness, numbness, change in gait,  Psychology  No depression, anxiety, insomnia.   Vitals:  Blood pressure 121/73, pulse (!) 56, temperature 98.3 F (36.8 C), temperature source Oral, resp. rate 18, weight 187 lb (84.8 kg), SpO2 99 %.  Physical Exam: WD in NAD Neck  Supple NROM, without any enlargements.  Lymph Node Survey No cervical supraclavicular or inguinal adenopathy Cardiovascular  Pulse normal rate, regularity and rhythm. S1 and S2 normal.  Lungs  Clear to auscultation bilateraly, without wheezes/crackles/rhonchi. Good air movement.  Skin  No rash/lesions/breakdown  Psychiatry  Alert and oriented to person, place, and time   Abdomen  Normoactive bowel sounds, abdomen soft, non-tender and overweight without evidence of hernia.  Back No CVA tenderness Genito Urinary  Vulva/vagina: Normal external female genitalia.  No lesions. No discharge or bleeding.  Bladder/urethra:  No lesions or masses, well supported bladder  Vagina: normal in appearance at the cuff- no bleeding/nodules/lesions or discharge. The previously palpable 4cm mass at the vaginal cuff that was adherent to the vagina is not appreciated well on RV exam anymore (has regressed). THere is still some tenderness on exam.  Adnexa: see vaginal exam.  Rectal  Good tone, no masses no cul de sac nodularity.  Extremities  No bilateral cyanosis, clubbing or edema.   Donaciano Eva, MD  06/07/2017, 5:38 PM

## 2017-06-07 NOTE — Patient Instructions (Signed)
Plan to have lab work prior to your CT scan on August 27 at the [facility].  Plan to have a CT scan of the abdomen and pelvis prior to your appt with Dr. Denman George in Sept 2018.  Please call for any questions or concerns.

## 2017-07-23 DIAGNOSIS — K219 Gastro-esophageal reflux disease without esophagitis: Secondary | ICD-10-CM | POA: Diagnosis not present

## 2017-07-23 DIAGNOSIS — R195 Other fecal abnormalities: Secondary | ICD-10-CM | POA: Diagnosis not present

## 2017-07-23 DIAGNOSIS — R109 Unspecified abdominal pain: Secondary | ICD-10-CM | POA: Diagnosis not present

## 2017-08-09 ENCOUNTER — Other Ambulatory Visit (HOSPITAL_BASED_OUTPATIENT_CLINIC_OR_DEPARTMENT_OTHER): Payer: Medicare Other

## 2017-08-09 DIAGNOSIS — N83202 Unspecified ovarian cyst, left side: Secondary | ICD-10-CM

## 2017-08-09 DIAGNOSIS — N83209 Unspecified ovarian cyst, unspecified side: Secondary | ICD-10-CM | POA: Diagnosis not present

## 2017-08-09 DIAGNOSIS — Z23 Encounter for immunization: Secondary | ICD-10-CM | POA: Diagnosis not present

## 2017-08-09 DIAGNOSIS — R7303 Prediabetes: Secondary | ICD-10-CM | POA: Diagnosis not present

## 2017-08-09 DIAGNOSIS — K219 Gastro-esophageal reflux disease without esophagitis: Secondary | ICD-10-CM | POA: Diagnosis not present

## 2017-08-09 DIAGNOSIS — K589 Irritable bowel syndrome without diarrhea: Secondary | ICD-10-CM | POA: Diagnosis not present

## 2017-08-09 DIAGNOSIS — E669 Obesity, unspecified: Secondary | ICD-10-CM | POA: Diagnosis not present

## 2017-08-09 DIAGNOSIS — Z0000 Encounter for general adult medical examination without abnormal findings: Secondary | ICD-10-CM | POA: Diagnosis not present

## 2017-08-09 DIAGNOSIS — E7800 Pure hypercholesterolemia, unspecified: Secondary | ICD-10-CM | POA: Diagnosis not present

## 2017-08-09 DIAGNOSIS — M545 Low back pain: Secondary | ICD-10-CM | POA: Diagnosis not present

## 2017-08-09 DIAGNOSIS — N183 Chronic kidney disease, stage 3 (moderate): Secondary | ICD-10-CM | POA: Diagnosis not present

## 2017-08-09 DIAGNOSIS — Z1389 Encounter for screening for other disorder: Secondary | ICD-10-CM | POA: Diagnosis not present

## 2017-08-09 DIAGNOSIS — N951 Menopausal and female climacteric states: Secondary | ICD-10-CM | POA: Diagnosis not present

## 2017-08-09 DIAGNOSIS — Z1159 Encounter for screening for other viral diseases: Secondary | ICD-10-CM | POA: Diagnosis not present

## 2017-08-09 LAB — COMPREHENSIVE METABOLIC PANEL
ALBUMIN: 3.8 g/dL (ref 3.5–5.0)
ALK PHOS: 66 U/L (ref 40–150)
ALT: 14 U/L (ref 0–55)
AST: 17 U/L (ref 5–34)
Anion Gap: 8 mEq/L (ref 3–11)
BUN: 33.3 mg/dL — AB (ref 7.0–26.0)
CALCIUM: 10.6 mg/dL — AB (ref 8.4–10.4)
CO2: 27 mEq/L (ref 22–29)
CREATININE: 1.1 mg/dL (ref 0.6–1.1)
Chloride: 105 mEq/L (ref 98–109)
EGFR: 54 mL/min/{1.73_m2} — ABNORMAL LOW (ref >90)
Glucose: 99 mg/dl (ref 70–140)
Potassium: 4.6 mEq/L (ref 3.5–5.1)
Sodium: 140 mEq/L (ref 136–145)
Total Bilirubin: 0.87 mg/dL (ref 0.20–1.20)
Total Protein: 7.1 g/dL (ref 6.4–8.3)

## 2017-08-17 ENCOUNTER — Ambulatory Visit (HOSPITAL_COMMUNITY)
Admission: RE | Admit: 2017-08-17 | Discharge: 2017-08-17 | Disposition: A | Discharge status: Home / Self Care | Payer: Medicare Other | Source: Ambulatory Visit | Attending: Gynecologic Oncology | Admitting: Gynecologic Oncology

## 2017-08-17 ENCOUNTER — Encounter (HOSPITAL_COMMUNITY): Payer: Self-pay

## 2017-08-17 DIAGNOSIS — N83202 Unspecified ovarian cyst, left side: Secondary | ICD-10-CM | POA: Diagnosis not present

## 2017-08-17 DIAGNOSIS — N289 Disorder of kidney and ureter, unspecified: Secondary | ICD-10-CM | POA: Insufficient documentation

## 2017-08-17 DIAGNOSIS — I700 Atherosclerosis of aorta: Secondary | ICD-10-CM | POA: Insufficient documentation

## 2017-08-17 MED ORDER — IOPAMIDOL (ISOVUE-300) INJECTION 61%
INTRAVENOUS | Status: AC
  Filled 2017-08-17: qty 100

## 2017-08-17 MED ORDER — IOPAMIDOL (ISOVUE-300) INJECTION 61%
100.0000 mL | Freq: Once | INTRAVENOUS | Status: AC | PRN
  Administered 2017-08-17: 100 mL via INTRAVENOUS

## 2017-08-19 ENCOUNTER — Telehealth: Payer: Self-pay

## 2017-08-19 NOTE — Telephone Encounter (Signed)
Discussed results of the CT scan with [facility] as noted below by Dr. Denman George. Pt.'s pain has resolved.  She has been formally diagnosed with IBS  During this time by Dr. Alessandra Bevels with [facility]. She has been using Bentyl daily which has helped with the IBS pain. Pt will call if she experiences and future abdominal pain that may need evaluation from gyn standpoint. She  will discuss her hot flashes with Dr. Radene Knee if no improvement in ~a month.

## 2017-08-19 NOTE — Telephone Encounter (Signed)
-----   Message from Everitt Amber, MD sent at 08/19/2017 10:04 AM EDT ----- This scan shows resolution of the left ovarian endometriotic implant since she stopped her estradiol.  If her symptoms remain improved, no intervention is necessary. I'm not sure if she has an appointment to see me. If so, then I can discuss the results with her at that visit.

## 2017-08-20 ENCOUNTER — Ambulatory Visit: Payer: Self-pay | Admitting: Gynecologic Oncology

## 2017-08-24 DIAGNOSIS — Z23 Encounter for immunization: Secondary | ICD-10-CM | POA: Diagnosis not present

## 2018-01-19 DIAGNOSIS — Z791 Long term (current) use of non-steroidal anti-inflammatories (NSAID): Secondary | ICD-10-CM | POA: Diagnosis not present

## 2018-01-19 DIAGNOSIS — R109 Unspecified abdominal pain: Secondary | ICD-10-CM | POA: Diagnosis not present

## 2018-01-19 DIAGNOSIS — K219 Gastro-esophageal reflux disease without esophagitis: Secondary | ICD-10-CM | POA: Diagnosis not present

## 2018-01-25 DIAGNOSIS — Z1231 Encounter for screening mammogram for malignant neoplasm of breast: Secondary | ICD-10-CM | POA: Diagnosis not present

## 2018-02-09 DIAGNOSIS — R739 Hyperglycemia, unspecified: Secondary | ICD-10-CM | POA: Diagnosis not present

## 2018-02-09 DIAGNOSIS — R002 Palpitations: Secondary | ICD-10-CM | POA: Diagnosis not present

## 2018-02-09 DIAGNOSIS — E7800 Pure hypercholesterolemia, unspecified: Secondary | ICD-10-CM | POA: Diagnosis not present

## 2018-03-14 NOTE — Progress Notes (Signed)
Cardiology Office Note   Date:  03/17/2018   ID:  Nitasha, Jewel 06-09-1946, MRN [id number]  PCP:  Kelton Pillar, MD  Cardiologist:   Jenkins Rouge, MD   No chief complaint on file.     History of Present Illness: Abigail Guzman is a 72 y.o. female who presents for consultation regarding bradycardia. Referred by Dr Laurann Montana Reviewed her office note indicating some symptomatic dizziness. Patient has been on a diet and not clear if symptoms related to this or low HR. No chest pain or dyspnea Occasional palpitations   A1c 6.2 LDL 96 CR 1.3 K 4.2 TSH 3.3   She has more vagal like episodes with nausea. Lightheaded spells monthly don't seem to have Any relationship to pulse or BP. Last episode in [place] a month ago lasted 10 minutes ? Due to Altitude Spells have been going on for about 6 months    Past Medical History:  Diagnosis Date  . Arthritis   . Bradycardia   . GERD (gastroesophageal reflux disease)   . Hiatal hernia   . Hyperlipidemia   . Joint pain   . Left ovarian cyst 04/12/2017  . Night sweats   . Palpitation   . Prediabetes     Past Surgical History:  Procedure Laterality Date  . ABDOMINAL HYSTERECTOMY    . COSMETIC SURGERY       Current Outpatient Medications  Medication Sig Dispense Refill  . dicyclomine (BENTYL) 10 MG capsule Take 10 mg by mouth 2 (two) times daily at 10 AM and 5 PM. PATIENT CAN TAKE AN EXTRA CAPSULES BY MOUTH AS NEEDED    . etodolac (LODINE) 400 MG tablet Take 400 mg by mouth 2 (two) times daily.    . hydrocortisone (ANUSOL-HC) 2.5 % rectal cream Place 1 application rectally 2 (two) times daily.    . hyoscyamine (LEVSIN) 0.125 MG/5ML ELIX Take 0.125 mg by mouth as directed.    . NON FORMULARY MULTI FOR HER PACKET , TAKE 1 PACKET BY MOUTH DAILY    . omeprazole (PRILOSEC) 40 MG capsule Take 40 mg by mouth daily.    . Probiotic Product (PROBIOTIC DAILY PO) Take by mouth daily.    . Probiotic Product (PROBIOTIC PO) Take by  mouth as directed.    . simvastatin (ZOCOR) 40 MG tablet Take 40 mg by mouth daily.    Marland Kitchen Specialty Vitamins Products (COLLAGEN ULTRA PO) Take by mouth. TAKE AS DIRECTED    . triamterene-hydrochlorothiazide (DYAZIDE) 37.5-25 MG per capsule Take 1 capsule by mouth daily.    . TURMERIC PO Take by mouth as directed.    Marland Kitchen VITAMIN D, CHOLECALCIFEROL, PO Take 1 tablet by mouth daily.     Current Facility-Administered Medications  Medication Dose Route Frequency Provider Last Rate Last Dose  . cetirizine (ZYRTEC) tablet 10 mg  10 mg Oral Once Tereasa Coop, PA-C        Allergies:   Sulfa antibiotics    Social History:  The patient  reports that she quit smoking about 38 years ago. Her smoking use included cigarettes. She has a 10.00 pack-year smoking history. She has never used smokeless tobacco. She reports that she drinks about 1.2 oz of alcohol per week. She reports that she has current or past drug history. Drug: Marijuana.   Family History:  The patient's family history includes Diabetes in her father; Heart disease in her father; Stroke in her mother.    ROS:  Please see the history  of present illness.   Otherwise, review of systems are positive for none.   All other systems are reviewed and negative.    PHYSICAL EXAM: VS:  BP 132/82   Pulse (!) 57   Ht 5\' 5"  (1.651 m)   Wt 200 lb 12 oz (91.1 kg)   SpO2 98%   BMI 33.41 kg/m  , BMI Body mass index is 33.41 kg/m. Affect appropriate Healthy:  appears stated age 31: normal Neck supple with no adenopathy JVP normal no bruits no thyromegaly Lungs clear with no wheezing and good diaphragmatic motion Heart:  S1/S2 no murmur, no rub, gallop or click PMI normal Abdomen: benighn, BS positve, no tenderness, no AAA no bruit.  No HSM or HJR Distal pulses intact with no bruits No edema Neuro non-focal Skin warm and dry No muscular weakness    EKG:  02/09/18 SR RBBB PR 150 msec PVC;s 03/17/18 SR RBBB low voltage rate 57     Recent Labs: 08/09/2017: ALT 14; BUN 33.3; Creatinine 1.1; Potassium 4.6; Sodium 140    Lipid Panel No results found for: CHOL, TRIG, HDL, CHOLHDL, VLDL, LDLCALC, LDLDIRECT    Wt Readings from Last 3 Encounters:  03/17/18 200 lb 12 oz (91.1 kg)  06/07/17 187 lb (84.8 kg)  04/12/17 182 lb 4.8 oz (82.7 kg)      Other studies Reviewed: Additional studies/ records that were reviewed today include: Notes Dr Laurann Montana ECG and labs .    ASSESSMENT AND PLAN:  1. Bradycardia seems benign and not related to any symptoms RBBB also benign Home readings Show pulses routinely in mid 50's will order echo to r/o structural heart issues and ETT to assess Chronotropic competence  2. HTN:  Well controlled.  Continue current medications and low sodium Dash type diet.    3. HLD on statin labs with primary  4. Pre DM:  Discussed low carb diet    Current medicines are reviewed at length with the patient today.  The patient does not have concerns regarding medicines.  The following changes have been made:  None   Labs/ tests ordered today include: ETT Echo and Event monitor   Orders Placed This Encounter  Procedures  . EXERCISE TOLERANCE TEST (ETT)  . EKG 12-Lead  . ECHOCARDIOGRAM COMPLETE     Disposition:   FU with me in 6 weeks      Signed, Jenkins Rouge, MD  03/17/2018 11:35 AM    [facility] [place], [place], [place]  [address] Phone: [phone]; Fax: [phone]

## 2018-03-17 ENCOUNTER — Encounter: Payer: Self-pay | Admitting: Cardiovascular Disease

## 2018-03-17 ENCOUNTER — Ambulatory Visit (INDEPENDENT_AMBULATORY_CARE_PROVIDER_SITE_OTHER): Payer: Medicare Other | Admitting: Cardiovascular Disease

## 2018-03-17 ENCOUNTER — Encounter (INDEPENDENT_AMBULATORY_CARE_PROVIDER_SITE_OTHER): Payer: Self-pay

## 2018-03-17 VITALS — BP 132/82 | HR 57 | Ht 65.0 in | Wt 200.8 lb

## 2018-03-17 DIAGNOSIS — R001 Bradycardia, unspecified: Secondary | ICD-10-CM | POA: Diagnosis not present

## 2018-03-17 DIAGNOSIS — R55 Syncope and collapse: Secondary | ICD-10-CM | POA: Diagnosis not present

## 2018-03-17 DIAGNOSIS — E7849 Other hyperlipidemia: Secondary | ICD-10-CM | POA: Diagnosis not present

## 2018-03-17 DIAGNOSIS — I10 Essential (primary) hypertension: Secondary | ICD-10-CM

## 2018-03-17 NOTE — Patient Instructions (Addendum)
Medication Instructions:  Your physician recommends that you continue on your current medications as directed. Please refer to the Current Medication list given to you today.  Labwork: NONE  Testing/Procedures: Your physician has requested that you have an echocardiogram. Echocardiography is a painless test that uses sound waves to create images of your heart. It provides your doctor with information about the size and shape of your heart and how well your heart's chambers and valves are working. This procedure takes approximately one hour. There are no restrictions for this procedure.  Your physician has requested that you have an exercise tolerance test. For further information please visit [email or web address]. Please also follow instruction sheet, as given.  Follow-Up: Your physician wants you to follow-up in: 6 months with Dr. Johnsie Cancel. You will receive a reminder letter in the mail two months in advance. If you don't receive a letter, please call our office to schedule the follow-up appointment.   If you need a refill on your cardiac medications before your next appointment, please call your pharmacy.

## 2018-03-29 ENCOUNTER — Ambulatory Visit (HOSPITAL_COMMUNITY): Payer: Medicare Other | Attending: Cardiovascular Disease

## 2018-03-29 ENCOUNTER — Other Ambulatory Visit: Payer: Self-pay

## 2018-03-29 ENCOUNTER — Ambulatory Visit (INDEPENDENT_AMBULATORY_CARE_PROVIDER_SITE_OTHER): Payer: Medicare Other

## 2018-03-29 DIAGNOSIS — R001 Bradycardia, unspecified: Secondary | ICD-10-CM | POA: Insufficient documentation

## 2018-03-29 DIAGNOSIS — Z8249 Family history of ischemic heart disease and other diseases of the circulatory system: Secondary | ICD-10-CM | POA: Insufficient documentation

## 2018-03-29 DIAGNOSIS — R55 Syncope and collapse: Secondary | ICD-10-CM | POA: Insufficient documentation

## 2018-03-29 DIAGNOSIS — I10 Essential (primary) hypertension: Secondary | ICD-10-CM

## 2018-03-29 DIAGNOSIS — E785 Hyperlipidemia, unspecified: Secondary | ICD-10-CM | POA: Insufficient documentation

## 2018-03-29 DIAGNOSIS — E7849 Other hyperlipidemia: Secondary | ICD-10-CM | POA: Diagnosis not present

## 2018-03-29 DIAGNOSIS — I351 Nonrheumatic aortic (valve) insufficiency: Secondary | ICD-10-CM | POA: Insufficient documentation

## 2018-03-29 DIAGNOSIS — Z87891 Personal history of nicotine dependence: Secondary | ICD-10-CM | POA: Diagnosis not present

## 2018-03-29 LAB — EXERCISE TOLERANCE TEST
CSEPED: 3 min
CSEPEW: 4.7 METS
CSEPPHR: 125 {beats}/min
Exercise duration (sec): 6 s
MPHR: 151 {beats}/min
Percent HR: 82 %
RPE: 16
Rest HR: 56 {beats}/min

## 2018-04-27 ENCOUNTER — Ambulatory Visit (INDEPENDENT_AMBULATORY_CARE_PROVIDER_SITE_OTHER): Payer: Medicare Other

## 2018-04-27 ENCOUNTER — Ambulatory Visit (INDEPENDENT_AMBULATORY_CARE_PROVIDER_SITE_OTHER): Payer: Medicare Other | Admitting: Orthopaedic Surgery

## 2018-04-27 ENCOUNTER — Encounter (INDEPENDENT_AMBULATORY_CARE_PROVIDER_SITE_OTHER): Payer: Self-pay | Admitting: Orthopaedic Surgery

## 2018-04-27 DIAGNOSIS — M545 Low back pain: Secondary | ICD-10-CM

## 2018-04-27 DIAGNOSIS — G8929 Other chronic pain: Secondary | ICD-10-CM | POA: Diagnosis not present

## 2018-04-27 NOTE — Progress Notes (Signed)
Office Visit Note   Patient: Abigail Guzman           Date of Birth: September 13, 1946           MRN: [id number] Visit Date: 04/27/2018              Requested by: Kelton Pillar, MD [address] [place], [place] [address] PCP: Kelton Pillar, MD   Assessment & Plan: Visit Diagnoses:  1. Chronic bilateral low back pain, with sciatica presence unspecified     Plan: At this point I do feel an MRI is warranted lumbar spine considering the severity of the arthritic changes that I see at the lower aspects of the lumbar spine.  She is already tried conservative treatment with rest, ice, heat, anti-inflammatories, activity modification, weight loss, chiropractic and massage therapy treatments.  We will see her back after the MRI.  All questions concerns were answered and addressed.  Follow-Up Instructions: Return in about 2 weeks (around 05/11/2018).   Orders:  Orders Placed This Encounter  Procedures  . XR Lumbar Spine 2-3 Views   No orders of the defined types were placed in this encounter.     Procedures: No procedures performed   Clinical Data: No additional findings.   Subjective: Chief Complaint  Patient presents with  . Lower Back - Pain  Patient somewhat I am seeing for the first time.  She has low back pain for over a decade now.  She has had epidural steroid injections in the past years ago.  About 3 months ago her back went out and she is been to a chiropractor and massage therapist and really still cannot get this most current "flareup" to go away.  She has a lot of pain in her sciatic region bilaterally more the right than left.  She denies any numbness tingling the feet or weakness in her feet.  She lives walk and this is been stopping her from walking to the significant pain she is been having.  She said her flareup she is in the last as long as she just can get things to calm down her feel better at all.  She denies any groin pain.  She is not  a diabetic.  HPI  Review of Systems She currently denies any headache, chest pain, shortness of breath, fever, chills, nausea, vomiting.  She denies any change in bowel bladder function.  Objective: Vital Signs: There were no vitals taken for this visit.  Physical Exam She is alert and oriented x3 and in no acute distress Ortho Exam Examination of both her hips is normal.  Examination of the lumbar spine she is incredibly tight hamstrings.  She has significant limitations in flexion extension of her lumbar spine.  She cannot even touch on her ankles and I cannot extend her well.  She has pain on static stretch bilaterally worse on the right than left.  Her motor and sensory exam in both lower extremities is otherwise normal.  Her reflexes are normal. Specialty Comments:  No specialty comments available.  Imaging: Xr Lumbar Spine 2-3 Views  Result Date: 04/27/2018 2 views of the lumbar spine show no acute findings.  The overall alignment is well-maintained however there is severe degenerative disc disease at L5-S1 as well as severe facet disease at the lower aspect lumbar spine.  This is compared to an abdominal CT scan done in 2018 which also showed significant lumbar arthritic changes and spondylosis.  PMFS History: Patient Active Problem List   Diagnosis Date Noted  . Left ovarian cyst 04/12/2017   Past Medical History:  Diagnosis Date  . Arthritis   . Bradycardia   . GERD (gastroesophageal reflux disease)   . Hiatal hernia   . Hyperlipidemia   . Joint pain   . Left ovarian cyst 04/12/2017  . Night sweats   . Palpitation   . Prediabetes     Family History  Problem Relation Age of Onset  . Stroke Mother   . Heart disease Father   . Diabetes Father     Past Surgical History:  Procedure Laterality Date  . ABDOMINAL HYSTERECTOMY    . COSMETIC SURGERY     Social History   Occupational History  . Not on file  Tobacco Use  . Smoking status: Former Smoker     Packs/day: 1.00    Years: 10.00    Pack years: 10.00    Types: Cigarettes    Last attempt to quit: 04/13/1979    Years since quitting: 39.0  . Smokeless tobacco: Never Used  Substance and Sexual Activity  . Alcohol use: Yes    Alcohol/week: 1.2 oz    Types: 2 Glasses of wine per week  . Drug use: Yes    Types: Marijuana  . Sexual activity: Not on file

## 2018-04-28 ENCOUNTER — Other Ambulatory Visit (INDEPENDENT_AMBULATORY_CARE_PROVIDER_SITE_OTHER): Payer: Self-pay

## 2018-04-28 DIAGNOSIS — M4807 Spinal stenosis, lumbosacral region: Secondary | ICD-10-CM

## 2018-05-07 ENCOUNTER — Ambulatory Visit
Admission: RE | Admit: 2018-05-07 | Discharge: 2018-05-07 | Disposition: A | Discharge status: Home / Self Care | Payer: Medicare Other | Source: Ambulatory Visit | Attending: Orthopaedic Surgery | Admitting: Orthopaedic Surgery

## 2018-05-07 DIAGNOSIS — M48061 Spinal stenosis, lumbar region without neurogenic claudication: Secondary | ICD-10-CM | POA: Diagnosis not present

## 2018-05-07 DIAGNOSIS — M4807 Spinal stenosis, lumbosacral region: Secondary | ICD-10-CM

## 2018-05-10 DIAGNOSIS — Z01419 Encounter for gynecological examination (general) (routine) without abnormal findings: Secondary | ICD-10-CM | POA: Diagnosis not present

## 2018-05-10 DIAGNOSIS — Z6832 Body mass index (BMI) 32.0-32.9, adult: Secondary | ICD-10-CM | POA: Diagnosis not present

## 2018-05-11 ENCOUNTER — Encounter (INDEPENDENT_AMBULATORY_CARE_PROVIDER_SITE_OTHER): Payer: Self-pay | Admitting: Orthopaedic Surgery

## 2018-05-11 ENCOUNTER — Ambulatory Visit (INDEPENDENT_AMBULATORY_CARE_PROVIDER_SITE_OTHER): Payer: Medicare Other | Admitting: Orthopaedic Surgery

## 2018-05-11 DIAGNOSIS — M4807 Spinal stenosis, lumbosacral region: Secondary | ICD-10-CM

## 2018-05-11 DIAGNOSIS — M5441 Lumbago with sciatica, right side: Secondary | ICD-10-CM | POA: Diagnosis not present

## 2018-05-11 MED ORDER — ACETAMINOPHEN-CODEINE #3 300-30 MG PO TABS: 1.0000 | ORAL_TABLET | Freq: Three times a day (TID) | ORAL | 0 refills | Status: DC | PRN

## 2018-05-11 NOTE — Progress Notes (Signed)
The patient is following up after having an MRI of her lumbar spine.  She has been having right-sided back pain and sciatica.  There is been a little bit of trochanteric bursitis and IT band syndrome but on exam it seems to be more of a back issue.  She is been to a chiropractor and been taking anti-inflammatories as well.  This is helped just a little bit but the MRI is warranted based on the radicular symptoms she is having.  On exam I can easily move her right hip the range of motion with no difficulty at all and no pain in the groin.  Her pain over the trochanteric area is minimal.  Her pain seems to be more low back to the right side with pain on stretch of sciatic nerve area.  She has slight decreased sensation L4.  Her strength is normal and reflexes normal bilaterally.  MRI is reviewed with her and it does show a disc protrusion to the right at L4-L5 with some moderate facet joint arthritis that is likely causing irritation of the nerves in this area.  I do feel that her clinical exam also points to a good correlation with the MRI.  I would like to send her to Dr. Ernestina Patches for considering a right-sided L4-L5 injection.  She would like to try this as well.  We will work on making this appointment happen.  I did send in some Tylenol 3 for her.  I will see her back myself in a month to see how she is responded to this treatment plan.  All questions and concerns were encouraged and addressed.

## 2018-05-13 ENCOUNTER — Other Ambulatory Visit (INDEPENDENT_AMBULATORY_CARE_PROVIDER_SITE_OTHER): Payer: Self-pay

## 2018-05-13 DIAGNOSIS — M545 Low back pain: Principal | ICD-10-CM

## 2018-05-13 DIAGNOSIS — G8929 Other chronic pain: Secondary | ICD-10-CM

## 2018-06-08 ENCOUNTER — Ambulatory Visit (INDEPENDENT_AMBULATORY_CARE_PROVIDER_SITE_OTHER): Payer: Medicare Other | Admitting: Orthopaedic Surgery

## 2018-06-09 ENCOUNTER — Encounter

## 2018-06-09 ENCOUNTER — Encounter (INDEPENDENT_AMBULATORY_CARE_PROVIDER_SITE_OTHER): Payer: Self-pay | Admitting: Physical Medicine and Rehabilitation

## 2018-06-09 ENCOUNTER — Ambulatory Visit (INDEPENDENT_AMBULATORY_CARE_PROVIDER_SITE_OTHER): Payer: Self-pay

## 2018-06-09 ENCOUNTER — Ambulatory Visit (INDEPENDENT_AMBULATORY_CARE_PROVIDER_SITE_OTHER): Payer: Medicare Other | Admitting: Physical Medicine and Rehabilitation

## 2018-06-09 VITALS — BP 136/75 | HR 59

## 2018-06-09 DIAGNOSIS — M5416 Radiculopathy, lumbar region: Secondary | ICD-10-CM

## 2018-06-09 DIAGNOSIS — M48062 Spinal stenosis, lumbar region with neurogenic claudication: Secondary | ICD-10-CM

## 2018-06-09 MED ORDER — METHYLPREDNISOLONE ACETATE 80 MG/ML IJ SUSP
80.0000 mg | Freq: Once | INTRAMUSCULAR | Status: AC
  Administered 2018-06-09: 80 mg

## 2018-06-09 NOTE — Progress Notes (Signed)
Numeric Pain Rating Scale and Functional Assessment Average Pain 9   In the last MONTH (on 0-10 scale) has pain interfered with the following?  1. General activity like being  able to carry out your everyday physical activities such as walking, climbing stairs, carrying groceries, or moving a chair?  Rating(6)    +Driver, -BT, -Dye Allergies. 

## 2018-06-09 NOTE — Patient Instructions (Signed)

## 2018-06-10 NOTE — Procedures (Signed)
Lumbar Epidural Steroid Injection - Interlaminar Approach with Fluoroscopic Guidance  Patient: Abigail Guzman      Date of Birth: 1946-08-11 MRN: [id number] PCP: Kelton Pillar, MD      Visit Date: 06/09/2018   Universal Protocol:     Consent Given By: the patient  Position: PRONE  Additional Comments: Vital signs were monitored before and after the procedure. Patient was prepped and draped in the usual sterile fashion. The correct patient, procedure, and site was verified.   Injection Procedure Details:  Procedure Site One Meds Administered:  Meds ordered this encounter  Medications  . methylPREDNISolone acetate (DEPO-MEDROL) injection 80 mg     Laterality: Right  Location/Site:  L5-S1  Needle size: 20 G  Needle type: Tuohy  Needle Placement: Paramedian epidural  Findings:   -Comments: Excellent flow of contrast into the epidural space.  Procedure Details: Using a paramedian approach from the side mentioned above, the region overlying the inferior lamina was localized under fluoroscopic visualization and the soft tissues overlying this structure were infiltrated with 4 ml. of 1% Lidocaine without Epinephrine. The Tuohy needle was inserted into the epidural space using a paramedian approach.   The epidural space was localized using loss of resistance along with lateral and bi-planar fluoroscopic views.  After negative aspirate for air, blood, and CSF, a 2 ml. volume of Isovue-250 was injected into the epidural space and the flow of contrast was observed. Radiographs were obtained for documentation purposes.    The injectate was administered into the level noted above.   Additional Comments:  The patient tolerated the procedure well Dressing: Band-Aid    Post-procedure details: Patient was observed during the procedure. Post-procedure instructions were reviewed.  Patient left the clinic in stable condition.

## 2018-06-10 NOTE — Progress Notes (Signed)
Abigail Guzman - 72 y.o. female MRN [id number]  Date of birth: 1946-11-17  Office Visit Note: Visit Date: 06/09/2018 PCP: Kelton Pillar, MD Referred by: Kelton Pillar, MD  Subjective: Chief Complaint  Patient presents with  . Lower Back - Pain   HPI: Abigail Guzman is a 72 year old female with chronic worsening right hip and leg pain and low back pain.  She has history of prior epidural injections 2 years ago.  She had a "series of 3 ".  She is been followed closely by Dr. Ninfa Linden who obtained an MRI of her lumbar spine and this is reviewed below.  This shows paracentral disc protrusion at L4-5 with lateral recess narrowing on the right.  This does seem to fit with her symptoms.  She could have some facet mediated low back pain.  She is tried other alternative treatments leading up to this but just cannot get comfortable.  She is a chiropractic care as well as other medication management.  We are going to complete a right L5-S1 interlaminar epidural steroid injection.   ROS Otherwise per HPI.  Assessment & Plan: Visit Diagnoses:  1. Lumbar radiculopathy   2. Spinal stenosis of lumbar region with neurogenic claudication     Plan: No additional findings.   Meds & Orders:  Meds ordered this encounter  Medications  . methylPREDNISolone acetate (DEPO-MEDROL) injection 80 mg    Orders Placed This Encounter  Procedures  . XR C-ARM NO REPORT  . Epidural Steroid injection    Follow-up: Return if symptoms worsen or fail to improve, for Conider L4-5 facet injections.   Procedures: No procedures performed  Lumbar Epidural Steroid Injection - Interlaminar Approach with Fluoroscopic Guidance  Patient: Abigail Guzman      Date of Birth: 06-16-1946 MRN: [id number] PCP: Kelton Pillar, MD      Visit Date: 06/09/2018   Universal Protocol:     Consent Given By: the patient  Position: PRONE  Additional Comments: Vital signs were monitored before and after the  procedure. Patient was prepped and draped in the usual sterile fashion. The correct patient, procedure, and site was verified.   Injection Procedure Details:  Procedure Site One Meds Administered:  Meds ordered this encounter  Medications  . methylPREDNISolone acetate (DEPO-MEDROL) injection 80 mg     Laterality: Right  Location/Site:  L5-S1  Needle size: 20 G  Needle type: Tuohy  Needle Placement: Paramedian epidural  Findings:   -Comments: Excellent flow of contrast into the epidural space.  Procedure Details: Using a paramedian approach from the side mentioned above, the region overlying the inferior lamina was localized under fluoroscopic visualization and the soft tissues overlying this structure were infiltrated with 4 ml. of 1% Lidocaine without Epinephrine. The Tuohy needle was inserted into the epidural space using a paramedian approach.   The epidural space was localized using loss of resistance along with lateral and bi-planar fluoroscopic views.  After negative aspirate for air, blood, and CSF, a 2 ml. volume of Isovue-250 was injected into the epidural space and the flow of contrast was observed. Radiographs were obtained for documentation purposes.    The injectate was administered into the level noted above.   Additional Comments:  The patient tolerated the procedure well Dressing: Band-Aid    Post-procedure details: Patient was observed during the procedure. Post-procedure instructions were reviewed.  Patient left the clinic in stable condition.   Clinical History: MRI LUMBAR SPINE WITHOUT CONTRAST  TECHNIQUE: Multiplanar, multisequence MR imaging of the lumbar  spine was performed. No intravenous contrast was administered.  COMPARISON:  None.  FINDINGS: Segmentation:  Standard.  Alignment:  Physiologic.  Vertebrae:  No fracture, evidence of discitis, or bone lesion.  Conus medullaris and cauda equina: Conus extends to the T12  level. Conus and cauda equina appear normal.  Paraspinal and other soft tissues: No acute paraspinal abnormality.  Disc levels:  Disc spaces: Degenerative disc disease with mild disc height loss at T12-L1, L1-2 and L5-S1. Reactive endplate changes at R4-W5.  T12-L1: Mild broad-based disc bulge. No evidence of neural foraminal stenosis. No central canal stenosis.  L1-L2: Mild broad-based disc bulge. No evidence of neural foraminal stenosis. No central canal stenosis.  L2-L3: Mild broad-based disc bulge. Moderate bilateral facet arthropathy. No evidence of neural foraminal stenosis. No central canal stenosis.  L3-L4: Mild broad-based disc bulge. Mild bilateral facet arthropathy. No evidence of neural foraminal stenosis. No central canal stenosis.  L4-L5: Broad-based disc bulge with a shallow right paracentral/foraminal disc protrusion. Right lateral recess narrowing. Moderate bilateral facet arthropathy. Mild left foraminal stenosis. No right foraminal stenosis. No central canal stenosis.  L5-S1: Mild broad-based disc bulge. Mild bilateral facet arthropathy. No evidence of neural foraminal stenosis. No central canal stenosis.  IMPRESSION: 1. At L4-5 there is a broad-based disc bulge with a shallow right paracentral/foraminal disc protrusion. Right lateral recess narrowing. Moderate bilateral facet arthropathy. Mild left foraminal stenosis. No right foraminal stenosis. No central canal stenosis. 2. Lumbar spine spondylosis as described above. 3.  No acute osseous injury of the lumbar spine.   Electronically Signed   By: Kathreen Devoid   On: 05/07/2018 19:26   She reports that she quit smoking about 39 years ago. Her smoking use included cigarettes. She has a 10.00 pack-year smoking history. She has never used smokeless tobacco. No results for input(s): HGBA1C, LABURIC in the last 8760 hours.  Objective:  VS:  HT:    WT:   BMI:     BP:136/75  HR:(!) 59bpm   TEMP: ( )  RESP:  Physical Exam  Ortho Exam Imaging: Xr C-arm No Report  Result Date: 06/09/2018 Please see Notes tab for imaging impression.   Past Medical/Family/Surgical/Social History: Medications & Allergies reviewed per EMR, new medications updated. Patient Active Problem List   Diagnosis Date Noted  . Left ovarian cyst 04/12/2017   Past Medical History:  Diagnosis Date  . Arthritis   . Bradycardia   . GERD (gastroesophageal reflux disease)   . Hiatal hernia   . Hyperlipidemia   . Joint pain   . Left ovarian cyst 04/12/2017  . Night sweats   . Palpitation   . Prediabetes    Family History  Problem Relation Age of Onset  . Stroke Mother   . Heart disease Father   . Diabetes Father    Past Surgical History:  Procedure Laterality Date  . ABDOMINAL HYSTERECTOMY    . COSMETIC SURGERY     Social History   Occupational History  . Not on file  Tobacco Use  . Smoking status: Former Smoker    Packs/day: 1.00    Years: 10.00    Pack years: 10.00    Types: Cigarettes    Last attempt to quit: 04/13/1979    Years since quitting: 39.1  . Smokeless tobacco: Never Used  Substance and Sexual Activity  . Alcohol use: Yes    Alcohol/week: 1.2 oz    Types: 2 Glasses of wine per week  . Drug use: Yes  Types: Marijuana  . Sexual activity: Not on file

## 2018-06-13 ENCOUNTER — Ambulatory Visit (INDEPENDENT_AMBULATORY_CARE_PROVIDER_SITE_OTHER): Payer: Medicare Other | Admitting: Orthopaedic Surgery

## 2018-06-13 DIAGNOSIS — M545 Low back pain: Secondary | ICD-10-CM

## 2018-06-13 DIAGNOSIS — G8929 Other chronic pain: Secondary | ICD-10-CM | POA: Diagnosis not present

## 2018-06-13 MED ORDER — ACETAMINOPHEN-CODEINE #3 300-30 MG PO TABS: 1.0000 | ORAL_TABLET | Freq: Three times a day (TID) | ORAL | 0 refills | Status: DC | PRN

## 2018-06-13 NOTE — Progress Notes (Signed)
The patient is only 4 days status post an interlaminar injection by Dr. Ernestina Patches to the right-sided L4-L5.  She has not had any as of yet.  She has no significant relief disc protrusion of the right side.  She is not walking assistive device and says she is actually doing okay.  She is been through therapy in the past and is not interested in any more therapy.  On examination she still has a positive  straight leg raise to the right side but overall does not appear to be worsened in her signs and symptoms and is walking without assistive device.  We will continue her Tylenol 3 and hopefully the injection will start having some positive effects on her over the next several days to weeks.  We will see her back in 3 weeks to see how she is doing overall.  All question concerns were answered and addressed.

## 2018-07-18 ENCOUNTER — Other Ambulatory Visit (INDEPENDENT_AMBULATORY_CARE_PROVIDER_SITE_OTHER): Payer: Self-pay

## 2018-07-18 ENCOUNTER — Ambulatory Visit (INDEPENDENT_AMBULATORY_CARE_PROVIDER_SITE_OTHER): Payer: Medicare Other | Admitting: Physician Assistant

## 2018-07-18 ENCOUNTER — Encounter (INDEPENDENT_AMBULATORY_CARE_PROVIDER_SITE_OTHER): Payer: Self-pay | Admitting: Physician Assistant

## 2018-07-18 DIAGNOSIS — M545 Low back pain: Secondary | ICD-10-CM

## 2018-07-18 DIAGNOSIS — G8929 Other chronic pain: Secondary | ICD-10-CM

## 2018-07-18 NOTE — Progress Notes (Addendum)
Office Visit Note   Patient: Abigail Guzman           Date of Birth: 18-May-1946           MRN: [id number] Visit Date: 07/18/2018              Requested by: Kelton Pillar, MD [address] [place], [place] [address] PCP: Kelton Pillar, MD   Assessment & Plan: Visit Diagnoses:  1. Chronic bilateral low back pain without sciatica     Plan:  We will have her follow-up with Dr. Ernestina Patches for an epidural steroid injection lumbar spine.  Then we will have her follow-up with [place] 1 month status post injection at that time to restart her exercise program.  Also work on home exercise program and  work on core strengthening.  Questions were encouraged and answered.  Follow-Up Instructions: Follow in one month after Epidural injection   Orders:  No orders of the defined types were placed in this encounter.  No orders of the defined types were placed in this encounter.     Procedures: No procedures performed   Clinical Data: No additional findings.   Subjective: Chief Complaint  Patient presents with  . Lower Back - Follow-up    HPI Abigail Guzman returns today follow-up of her low back pain.  She states that the shot with Dr. Ernestina Patches which was a paramedia epidural injection L5 S1 with her lower back pain on the right.  Now she is having more pain on the left side.  She states she is unable to walk for exercise due to low back pain.  She is having no radicular symptoms down either leg.  She has throbbing pain in the low back.  States she would rather stand than sit.  No waking pain.   Review of Systems Please see HPI otherwise negative Objective: Vital Signs: There were no vitals taken for this visit.  Physical Exam General: Well-developed well-nourished female no acute distress mood and affect appropriate.  Ambulates without any assistive device.  Ortho Exam Negative straight leg raise bilaterally.  Tight hamstrings bilaterally.  She has limited flexion  extension lumbar spine without significant pain.  Tenderness in the right lower lumbar paraspinous region with palpation. Specialty Comments:  No specialty comments available.  Imaging: No results found.   PMFS History: Patient Active Problem List   Diagnosis Date Noted  . Left ovarian cyst 04/12/2017   Past Medical History:  Diagnosis Date  . Arthritis   . Bradycardia   . GERD (gastroesophageal reflux disease)   . Hiatal hernia   . Hyperlipidemia   . Joint pain   . Left ovarian cyst 04/12/2017  . Night sweats   . Palpitation   . Prediabetes     Family History  Problem Relation Age of Onset  . Stroke Mother   . Heart disease Father   . Diabetes Father     Past Surgical History:  Procedure Laterality Date  . ABDOMINAL HYSTERECTOMY    . COSMETIC SURGERY     Social History   Occupational History  . Not on file  Tobacco Use  . Smoking status: Former Smoker    Packs/day: 1.00    Years: 10.00    Pack years: 10.00    Types: Cigarettes    Last attempt to quit: 04/13/1979    Years since quitting: 39.2  . Smokeless tobacco: Never Used  Substance and Sexual Activity  . Alcohol use: Yes  Alcohol/week: 1.2 oz    Types: 2 Glasses of wine per week  . Drug use: Yes    Types: Marijuana  . Sexual activity: Not on file

## 2018-08-02 ENCOUNTER — Encounter (INDEPENDENT_AMBULATORY_CARE_PROVIDER_SITE_OTHER): Payer: Self-pay | Admitting: Physical Medicine and Rehabilitation

## 2018-08-02 ENCOUNTER — Ambulatory Visit (INDEPENDENT_AMBULATORY_CARE_PROVIDER_SITE_OTHER): Payer: Self-pay

## 2018-08-02 ENCOUNTER — Ambulatory Visit (INDEPENDENT_AMBULATORY_CARE_PROVIDER_SITE_OTHER): Payer: Medicare Other | Admitting: Physical Medicine and Rehabilitation

## 2018-08-02 VITALS — BP 145/72 | HR 50

## 2018-08-02 DIAGNOSIS — M47816 Spondylosis without myelopathy or radiculopathy, lumbar region: Secondary | ICD-10-CM

## 2018-08-02 DIAGNOSIS — M5416 Radiculopathy, lumbar region: Secondary | ICD-10-CM

## 2018-08-02 MED ORDER — BETAMETHASONE SOD PHOS & ACET 6 (3-3) MG/ML IJ SUSP
12.0000 mg | Freq: Once | INTRAMUSCULAR | Status: AC
  Administered 2018-08-02: 12 mg

## 2018-08-02 NOTE — Patient Instructions (Signed)

## 2018-08-02 NOTE — Progress Notes (Signed)
 .  Numeric Pain Rating Scale and Functional Assessment Average Pain 7   In the last MONTH (on 0-10 scale) has pain interfered with the following?  1. General activity like being  able to carry out your everyday physical activities such as walking, climbing stairs, carrying groceries, or moving a chair?  Rating(5)   +Driver, -BT, -Dye Allergies.  

## 2018-08-11 NOTE — Progress Notes (Signed)
Abigail Guzman - 72 y.o. female MRN [id number]  Date of birth: [birth year]  Office Visit Note: Visit Date: 08/02/2018 PCP: Kelton Pillar, MD Referred by: Kelton Pillar, MD  Subjective: Chief Complaint  Patient presents with  . Lower Back - Pain  . Right Leg - Pain   HPI: Abigail Guzman is a very pleasant 72 year old female originally seen by Dr. Jean Rosenthal in our office.  She was having a flareup of chronic low back pain.  She had treated this with a chiropractor and over-the-counter medications.  He ultimately did get an MRI of the lumbar spine which is reviewed below.  A few weeks ago we completed right L5-S1 interlaminar epidural steroid injection.  The patient reports that injections seem to help around 50% for 2 weeks.  She reports certain movements make the pain worse and pain medicine that she is taking seems to help to a degree.  She reports interestingly when you really dive down into the history more of a left-sided axial back pain worse with standing and extension but then more pain in the right hip and leg with prolonged sitting.  I feel like she is having both facet mediated pain as well as disc related pain which does fit with her MRI findings which again are reviewed below.  We are going to complete a right L4 transforaminal injection diagnostically and therapeutically as well as left L4-5 facet joint block.  Again depending on relief if she still had leg pain would look at an L5 transforaminal injection.  Could consider diagnostic blocks and ablation for the facet arthropathy.  She is failed conservative care otherwise.  Rates her pain is a 7 out of 10.   ROS Otherwise per HPI.  Assessment & Plan: Visit Diagnoses:  1. Lumbar radiculopathy   2. Spondylosis without myelopathy or radiculopathy, lumbar region     Plan: No additional findings.   Meds & Orders:  Meds ordered this encounter  Medications  . betamethasone acetate-betamethasone sodium phosphate  (CELESTONE) injection 12 mg    Orders Placed This Encounter  Procedures  . Facet Injection  . XR C-ARM NO REPORT  . Epidural Steroid injection    Follow-up: Return if symptoms worsen or fail to improve.   Procedures: No procedures performed  Lumbosacral Transforaminal Epidural Steroid Injection - Sub-Pedicular Approach with Fluoroscopic Guidance  Patient: Abigail Guzman      Date of Birth: Sep 26, 1946 MRN: [id number] PCP: Kelton Pillar, MD      Visit Date: 08/02/2018   Universal Protocol:    Date/Time: 08/02/2018  Consent Given By: the patient  Position: PRONE  Additional Comments: Vital signs were monitored before and after the procedure. Patient was prepped and draped in the usual sterile fashion. The correct patient, procedure, and site was verified.   Injection Procedure Details:  Procedure Site One Meds Administered:  Meds ordered this encounter  Medications  . betamethasone acetate-betamethasone sodium phosphate (CELESTONE) injection 12 mg    Laterality: Right  Location/Site:  L4-L5  Needle size: 22 G  Needle type: Spinal  Needle Placement: Transforaminal  Findings:    -Comments: Excellent flow of contrast along the nerve and into the epidural space.  Procedure Details: After squaring off the end-plates to get a true AP view, the C-arm was positioned so that an oblique view of the foramen as noted above was visualized. The target area is just inferior to the "nose of the scotty dog" or sub pedicular. The soft tissues overlying this structure were  infiltrated with 2-3 ml. of 1% Lidocaine without Epinephrine.  The spinal needle was inserted toward the target using a "trajectory" view along the fluoroscope beam.  Under AP and lateral visualization, the needle was advanced so it did not puncture dura and was located close the 6 O'Clock position of the pedical in AP tracterory. Biplanar projections were used to confirm position. Aspiration was confirmed  to be negative for CSF and/or blood. A 1-2 ml. volume of Isovue-250 was injected and flow of contrast was noted at each level. Radiographs were obtained for documentation purposes.   After attaining the desired flow of contrast documented above, a 0.5 to 1.0 ml test dose of 0.25% Marcaine was injected into each respective transforaminal space.  The patient was observed for 90 seconds post injection.  After no sensory deficits were reported, and normal lower extremity motor function was noted,   the above injectate was administered so that equal amounts of the injectate were placed at each foramen (level) into the transforaminal epidural space.   Additional Comments:  The patient tolerated the procedure well Dressing: Band-Aid    Post-procedure details: Patient was observed during the procedure. Post-procedure instructions were reviewed.  Patient left the clinic in stable condition.   Lumbar Facet Joint Intra-Articular Injection(s) with Fluoroscopic Guidance  Patient: Abigail Guzman      Date of Birth: May 26, 1946 MRN: [id number] PCP: Kelton Pillar, MD      Visit Date: 08/02/2018   Universal Protocol:    Date/Time: 08/02/2018  Consent Given By: the patient  Position: PRONE   Additional Comments: Vital signs were monitored before and after the procedure. Patient was prepped and draped in the usual sterile fashion. The correct patient, procedure, and site was verified.   Injection Procedure Details:  Procedure Site One Meds Administered:  Meds ordered this encounter  Medications  . betamethasone acetate-betamethasone sodium phosphate (CELESTONE) injection 12 mg     Laterality: Left  Location/Site:  L4-L5  Needle size: 22 guage  Needle type: Spinal  Needle Placement: Articular  Findings:  -Comments: Excellent flow of contrast producing a partial arthrogram.  Procedure Details: The fluoroscope beam is vertically oriented in AP, and the inferior recess is  visualized beneath the lower pole of the inferior apophyseal process, which represents the target point for needle insertion. When direct visualization is difficult the target point is located at the medial projection of the vertebral pedicle. The region overlying each aforementioned target is locally anesthetized with a 1 to 2 ml. volume of 1% Lidocaine without Epinephrine.   The spinal needle was inserted into each of the above mentioned facet joints using biplanar fluoroscopic guidance. A 0.25 to 0.5 ml. volume of Isovue-250 was injected and a partial facet joint arthrogram was obtained. A single spot film was obtained of the resulting arthrogram.    One to 1.25 ml of the steroid/anesthetic solution was then injected into each of the facet joints noted above.   Additional Comments:  The patient tolerated the procedure well Dressing: Band-Aid    Post-procedure details: Patient was observed during the procedure. Post-procedure instructions were reviewed.  Patient left the clinic in stable condition.    Clinical History: MRI LUMBAR SPINE WITHOUT CONTRAST  TECHNIQUE: Multiplanar, multisequence MR imaging of the lumbar spine was performed. No intravenous contrast was administered.  COMPARISON:  None.  FINDINGS: Segmentation:  Standard.  Alignment:  Physiologic.  Vertebrae:  No fracture, evidence of discitis, or bone lesion.  Conus medullaris and cauda equina: Conus extends to  the T12 level. Conus and cauda equina appear normal.  Paraspinal and other soft tissues: No acute paraspinal abnormality.  Disc levels:  Disc spaces: Degenerative disc disease with mild disc height loss at T12-L1, L1-2 and L5-S1. Reactive endplate changes at U7-M5.  T12-L1: Mild broad-based disc bulge. No evidence of neural foraminal stenosis. No central canal stenosis.  L1-L2: Mild broad-based disc bulge. No evidence of neural foraminal stenosis. No central canal stenosis.  L2-L3: Mild  broad-based disc bulge. Moderate bilateral facet arthropathy. No evidence of neural foraminal stenosis. No central canal stenosis.  L3-L4: Mild broad-based disc bulge. Mild bilateral facet arthropathy. No evidence of neural foraminal stenosis. No central canal stenosis.  L4-L5: Broad-based disc bulge with a shallow right paracentral/foraminal disc protrusion. Right lateral recess narrowing. Moderate bilateral facet arthropathy. Mild left foraminal stenosis. No right foraminal stenosis. No central canal stenosis.  L5-S1: Mild broad-based disc bulge. Mild bilateral facet arthropathy. No evidence of neural foraminal stenosis. No central canal stenosis.  IMPRESSION: 1. At L4-5 there is a broad-based disc bulge with a shallow right paracentral/foraminal disc protrusion. Right lateral recess narrowing. Moderate bilateral facet arthropathy. Mild left foraminal stenosis. No right foraminal stenosis. No central canal stenosis. 2. Lumbar spine spondylosis as described above. 3.  No acute osseous injury of the lumbar spine.   Electronically Signed   By: Kathreen Devoid   On: 05/07/2018 19:26   She reports that she quit smoking about 39 years ago. Her smoking use included cigarettes. She has a 10.00 pack-year smoking history. She has never used smokeless tobacco. No results for input(s): HGBA1C, LABURIC in the last 8760 hours.  Objective:  VS:  HT:    WT:   BMI:     BP:(!) 145/72  HR:(!) 50bpm  TEMP: ( )  RESP:  Physical Exam  Ortho Exam Imaging: No results found.  Past Medical/Family/Surgical/Social History: Medications & Allergies reviewed per EMR, new medications updated. Patient Active Problem List   Diagnosis Date Noted  . Left ovarian cyst 04/12/2017   Past Medical History:  Diagnosis Date  . Arthritis   . Bradycardia   . GERD (gastroesophageal reflux disease)   . Hiatal hernia   . Hyperlipidemia   . Joint pain   . Left ovarian cyst 04/12/2017  . Night sweats     . Palpitation   . Prediabetes    Family History  Problem Relation Age of Onset  . Stroke Mother   . Heart disease Father   . Diabetes Father    Past Surgical History:  Procedure Laterality Date  . ABDOMINAL HYSTERECTOMY    . COSMETIC SURGERY     Social History   Occupational History  . Not on file  Tobacco Use  . Smoking status: Former Smoker    Packs/day: 1.00    Years: 10.00    Pack years: 10.00    Types: Cigarettes    Last attempt to quit: 04/13/1979    Years since quitting: 39.3  . Smokeless tobacco: Never Used  Substance and Sexual Activity  . Alcohol use: Yes    Alcohol/week: 2.0 standard drinks    Types: 2 Glasses of wine per week  . Drug use: Yes    Types: Marijuana  . Sexual activity: Not on file

## 2018-08-11 NOTE — Procedures (Signed)
Lumbosacral Transforaminal Epidural Steroid Injection - Sub-Pedicular Approach with Fluoroscopic Guidance  Patient: Abigail Guzman      Date of Birth: [birth year] MRN: [id number] PCP: Kelton Pillar, MD      Visit Date: 08/02/2018   Universal Protocol:    Date/Time: 08/02/2018  Consent Given By: the patient  Position: PRONE  Additional Comments: Vital signs were monitored before and after the procedure. Patient was prepped and draped in the usual sterile fashion. The correct patient, procedure, and site was verified.   Injection Procedure Details:  Procedure Site One Meds Administered:  Meds ordered this encounter  Medications  . betamethasone acetate-betamethasone sodium phosphate (CELESTONE) injection 12 mg    Laterality: Right  Location/Site:  L4-L5  Needle size: 22 G  Needle type: Spinal  Needle Placement: Transforaminal  Findings:    -Comments: Excellent flow of contrast along the nerve and into the epidural space.  Procedure Details: After squaring off the end-plates to get a true AP view, the C-arm was positioned so that an oblique view of the foramen as noted above was visualized. The target area is just inferior to the "nose of the scotty dog" or sub pedicular. The soft tissues overlying this structure were infiltrated with 2-3 ml. of 1% Lidocaine without Epinephrine.  The spinal needle was inserted toward the target using a "trajectory" view along the fluoroscope beam.  Under AP and lateral visualization, the needle was advanced so it did not puncture dura and was located close the 6 O'Clock position of the pedical in AP tracterory. Biplanar projections were used to confirm position. Aspiration was confirmed to be negative for CSF and/or blood. A 1-2 ml. volume of Isovue-250 was injected and flow of contrast was noted at each level. Radiographs were obtained for documentation purposes.   After attaining the desired flow of contrast documented above, a 0.5  to 1.0 ml test dose of 0.25% Marcaine was injected into each respective transforaminal space.  The patient was observed for 90 seconds post injection.  After no sensory deficits were reported, and normal lower extremity motor function was noted,   the above injectate was administered so that equal amounts of the injectate were placed at each foramen (level) into the transforaminal epidural space.   Additional Comments:  The patient tolerated the procedure well Dressing: Band-Aid    Post-procedure details: Patient was observed during the procedure. Post-procedure instructions were reviewed.  Patient left the clinic in stable condition.

## 2018-08-11 NOTE — Procedures (Signed)
Lumbar Facet Joint Intra-Articular Injection(s) with Fluoroscopic Guidance  Patient: Abigail Guzman      Date of Birth: 1946-07-19 MRN: [id number] PCP: Kelton Pillar, MD      Visit Date: 08/02/2018   Universal Protocol:    Date/Time: 08/02/2018  Consent Given By: the patient  Position: PRONE   Additional Comments: Vital signs were monitored before and after the procedure. Patient was prepped and draped in the usual sterile fashion. The correct patient, procedure, and site was verified.   Injection Procedure Details:  Procedure Site One Meds Administered:  Meds ordered this encounter  Medications  . betamethasone acetate-betamethasone sodium phosphate (CELESTONE) injection 12 mg     Laterality: Left  Location/Site:  L4-L5  Needle size: 22 guage  Needle type: Spinal  Needle Placement: Articular  Findings:  -Comments: Excellent flow of contrast producing a partial arthrogram.  Procedure Details: The fluoroscope beam is vertically oriented in AP, and the inferior recess is visualized beneath the lower pole of the inferior apophyseal process, which represents the target point for needle insertion. When direct visualization is difficult the target point is located at the medial projection of the vertebral pedicle. The region overlying each aforementioned target is locally anesthetized with a 1 to 2 ml. volume of 1% Lidocaine without Epinephrine.   The spinal needle was inserted into each of the above mentioned facet joints using biplanar fluoroscopic guidance. A 0.25 to 0.5 ml. volume of Isovue-250 was injected and a partial facet joint arthrogram was obtained. A single spot film was obtained of the resulting arthrogram.    One to 1.25 ml of the steroid/anesthetic solution was then injected into each of the facet joints noted above.   Additional Comments:  The patient tolerated the procedure well Dressing: Band-Aid    Post-procedure details: Patient was observed  during the procedure. Post-procedure instructions were reviewed.  Patient left the clinic in stable condition.

## 2018-08-19 ENCOUNTER — Telehealth (INDEPENDENT_AMBULATORY_CARE_PROVIDER_SITE_OTHER): Payer: Self-pay | Admitting: Orthopaedic Surgery

## 2018-08-19 ENCOUNTER — Telehealth (INDEPENDENT_AMBULATORY_CARE_PROVIDER_SITE_OTHER): Payer: Self-pay | Admitting: Radiology

## 2018-08-19 DIAGNOSIS — M545 Low back pain: Secondary | ICD-10-CM

## 2018-08-19 NOTE — Telephone Encounter (Signed)
Patient called asked if she should schedule an appointment with Dr. Ninfa Linden. Patient said the injection in her back did not work.  Patient asked if she will be referred for (PT) The number to contact patient is [phone]

## 2018-08-19 NOTE — Telephone Encounter (Signed)
Patient called stated she had an injection on 08/02/18, the injection has not helped and the right side is worse. She has no follow up with Dr. Ernestina Patches or Dr. Ninfa Linden, patient asking about next step  Call back # [phone]

## 2018-08-19 NOTE — Telephone Encounter (Signed)
Please advise. Would you like patient to come in for appointment or referred for PT?

## 2018-08-19 NOTE — Telephone Encounter (Signed)
So, is the left better or is it that both are now bad and right > left? Can follow with either of [place], would try PT/Chiro if not not already done

## 2018-08-22 NOTE — Telephone Encounter (Signed)
I called and spoke with patient. She is going to try [facility] PT at [facility]. Referral entered.

## 2018-08-22 NOTE — Telephone Encounter (Signed)
Outpatient physical therapy is certainly worth trying as this standpoint.  Please put in an order for that. Thanks

## 2018-08-22 NOTE — Telephone Encounter (Signed)
Patient reports no relief from left L4-5 facet on 8/20. She reports that both sides hurt. She is going to try PT. Referred by Dr. Ninfa Linden.

## 2018-08-25 ENCOUNTER — Encounter: Payer: Self-pay | Admitting: Cardiovascular Disease

## 2018-08-30 NOTE — Progress Notes (Signed)
Cardiology Office Note   Date:  09/07/2018   ID:  Abigail Guzman [birth year], MRN [id number]  PCP:  Kelton Pillar, MD  Cardiologist:   Jenkins Rouge, MD   No chief complaint on file.     History of Present Illness: Abigail Guzman is a 72 y.o. female seen in f/u for bradycardia. First seen April 2019  Referred by Dr Laurann Montana Reviewed her office note indicating some symptomatic dizziness. Patient has been on a diet and not clear if symptoms related to this or low HR. No chest pain or dyspnea Occasional palpitations   A1c 6.2 LDL 96 CR 1.3 K 4.2 TSH 3.3   She has more vagal like episodes with nausea. Lightheaded spells monthly don't seem to have Any relationship to pulse or BP.   She has chronic RBBB F/U ETT non ischemic and she reached peak HR 128 bpm TTE with normal EF 60-65% and only mild AR  Activity limited by back pain. Has seen ortho, chiropractor and PT with injections Now on Tylenol 3 Getting back with her ex in [place] and may travel country in RV   Has gained about 30 lbs    Past Medical History:  Diagnosis Date  . Arthritis   . Bradycardia   . GERD (gastroesophageal reflux disease)   . Hiatal hernia   . Hyperlipidemia   . Joint pain   . Left ovarian cyst 04/12/2017  . Night sweats   . Palpitation   . Prediabetes     Past Surgical History:  Procedure Laterality Date  . ABDOMINAL HYSTERECTOMY    . COSMETIC SURGERY       Current Outpatient Medications  Medication Sig Dispense Refill  . acetaminophen-codeine (TYLENOL #3) 300-30 MG tablet Take 1-2 tablets by mouth every 8 (eight) hours as needed for moderate pain. 40 tablet 0  . dicyclomine (BENTYL) 10 MG capsule Take 10 mg by mouth 2 (two) times daily at 10 AM and 5 PM. PATIENT CAN TAKE AN EXTRA CAPSULES BY MOUTH AS NEEDED    . etodolac (LODINE) 400 MG tablet Take 400 mg by mouth 2 (two) times daily.    . hydrocortisone (ANUSOL-HC) 2.5 % rectal cream Place 1 application rectally 2 (two) times  daily.    . hyoscyamine (LEVSIN) 0.125 MG/5ML ELIX Take 0.125 mg by mouth as directed.    . NON FORMULARY MULTI FOR HER PACKET , TAKE 1 PACKET BY MOUTH DAILY    . omeprazole (PRILOSEC) 40 MG capsule Take 40 mg by mouth daily.    . Probiotic Product (PROBIOTIC PO) Take by mouth as directed.    . simvastatin (ZOCOR) 40 MG tablet Take 40 mg by mouth daily.    Marland Kitchen Specialty Vitamins Products (COLLAGEN ULTRA PO) Take by mouth. TAKE AS DIRECTED    . triamterene-hydrochlorothiazide (DYAZIDE) 37.5-25 MG per capsule Take 1 capsule by mouth daily.    . TURMERIC PO Take by mouth as directed.    Marland Kitchen VITAMIN D, CHOLECALCIFEROL, PO Take 1 tablet by mouth daily.     Current Facility-Administered Medications  Medication Dose Route Frequency Provider Last Rate Last Dose  . cetirizine (ZYRTEC) tablet 10 mg  10 mg Oral Once Tereasa Coop, PA-C        Allergies:   Sulfa antibiotics    Social History:  The patient  reports that she quit smoking about 39 years ago. Her smoking use included cigarettes. She has a 10.00 pack-year smoking history. She has never used smokeless  tobacco. She reports that she drinks about 2.0 standard drinks of alcohol per week. She reports that she has current or past drug history. Drug: Marijuana.   Family History:  The patient's family history includes Diabetes in her father; Heart disease in her father; Stroke in her mother.    ROS:  Please see the history of present illness.   Otherwise, review of systems are positive for none.   All other systems are reviewed and negative.    PHYSICAL EXAM: VS:  BP 122/86   Pulse (!) 58   Ht 5\' 5"  (1.651 m)   Wt 214 lb 4 oz (97.2 kg)   SpO2 96%   BMI 35.65 kg/m  , BMI Body mass index is 35.65 kg/m. Affect appropriate Healthy:  appears stated age 83: normal Neck supple with no adenopathy JVP normal no bruits no thyromegaly Lungs clear with no wheezing and good diaphragmatic motion Heart:  S1/S2 mild AR  murmur, no rub, gallop or  click PMI normal Abdomen: benighn, BS positve, no tenderness, no AAA no bruit.  No HSM or HJR Distal pulses intact with no bruits No edema Neuro non-focal Skin warm and dry No muscular weakness     EKG:  02/09/18 SR RBBB PR 150 msec PVC;s 03/17/18 SR RBBB low voltage rate 57    Recent Labs: No results found for requested labs within last 8760 hours.    Lipid Panel No results found for: CHOL, TRIG, HDL, CHOLHDL, VLDL, LDLCALC, LDLDIRECT    Wt Readings from Last 3 Encounters:  09/07/18 214 lb 4 oz (97.2 kg)  03/17/18 200 lb 12 oz (91.1 kg)  06/07/17 187 lb (84.8 kg)      Other studies Reviewed: Additional studies/ records that were reviewed today include: Notes Dr Laurann Montana ECG and labs .ETT 03/29/18 And TTE 03/29/18    ASSESSMENT AND PLAN:  1. Bradycardia seems benign and not related to any symptoms RBBB also benign Home readings Show pulses routinely in mid 50's  Peak HR during ETT 03/29/18 acceptable at 125 bpm  2. HTN:  Well controlled.  Continue current medications and low sodium Dash type diet.    3. HLD on statin labs with primary  4. Pre DM:  Discussed low carb diet  5. AR:  Mild by echo 03/29/18 with normal LV size and function EF 60-65% observe    Current medicines are reviewed at length with the patient today.  The patient does not have concerns regarding medicines.  The following changes have been made:  None   Labs/ tests ordered today include:    No orders of the defined types were placed in this encounter.    Disposition:   FU with me in a year     Signed, Jenkins Rouge, MD  09/07/2018 10:39 AM    [facility] [place], [place], [place]  [address] Phone: [phone]; Fax: [phone]

## 2018-09-01 ENCOUNTER — Ambulatory Visit: Payer: Medicare Other | Attending: Orthopaedic Surgery

## 2018-09-01 ENCOUNTER — Other Ambulatory Visit: Payer: Self-pay

## 2018-09-01 DIAGNOSIS — M545 Low back pain: Secondary | ICD-10-CM | POA: Diagnosis not present

## 2018-09-01 DIAGNOSIS — M62830 Muscle spasm of back: Secondary | ICD-10-CM | POA: Diagnosis not present

## 2018-09-01 NOTE — Therapy (Signed)
[place], [place], [address] Phone: [phone]   Fax:  [phone]  Physical Therapy Evaluation  Patient Details  Name: Abigail Guzman MRN: [id number] Date of Birth: [birth year] Referring Provider: Jean Rosenthal, MD   Encounter Date: 09/01/2018  PT End of Session - 09/01/18 1009    Visit Number  1    Number of Visits  12    Date for PT Re-Evaluation  10/14/18    Authorization Type  MCR    Authorization Time Period  Kx visit 15       Progress visit 10    PT Start Time  1005    PT Stop Time  1155    PT Time Calculation (min)  110 min    Activity Tolerance  Patient tolerated treatment well    Behavior During Therapy  Abigail Guzman for tasks assessed/performed       Past Medical History:  Diagnosis Date  . Arthritis   . Bradycardia   . GERD (gastroesophageal reflux disease)   . Hiatal hernia   . Hyperlipidemia   . Joint pain   . Left ovarian cyst 04/12/2017  . Night sweats   . Palpitation   . Prediabetes     Past Surgical History:  Procedure Laterality Date  . ABDOMINAL HYSTERECTOMY    . COSMETIC SURGERY      There were no vitals filed for this visit.   Subjective Assessment - 09/01/18 1018    Subjective  March 2019 back went out without specific incident .She has had back issues in past.  She saw chiropractor and improved 50% or more as she could hardly move.     Since then pain not better.   2 injections with limited benefit.  Significant degenerative  changes.   MD possible surgery.     Not able to walk as before    Limitations  Walking   stairs   How long can you sit comfortably?  60 min    How long can you stand comfortably?  AS needed    How long can you walk comfortably?  AS needed with meds              1/4 mile without meds    Diagnostic tests  XraysL4-5 Bulge disc press on nerves    Currently in Pain?  Yes    Pain Score  5    with  meds.    Pain Location  Back    Pain  Orientation  Right;Left;Lower    Pain Descriptors / Indicators  Radiating;Stabbing   like knife stuck to side.    Pain Radiating Towards  burning sensation top RT/Lt  foot.     Pain Onset  More than a month ago    Pain Frequency  Constant    Aggravating Factors   walking,  sitting .     Pain Relieving Factors  Meds          [facility] PT Assessment - 09/01/18 0001      Assessment   Medical Diagnosis  lower back pain    Referring Provider  Abigail Rosenthal, MD    Prior Therapy  no      Precautions   Precautions  None      Restrictions   Weight Bearing Restrictions  No      Balance Screen   Has the patient fallen in the past 6 months  No      Prior Function  Level of Independence  Independent    Vocation  Part time employment    Vocation Requirements  standing helps      Cognition   Overall Cognitive Status  Within Functional Limits for tasks assessed      Observation/Other Assessments   Focus on Therapeutic Outcomes (FOTO)   50% limited      Posture/Postural Control   Posture Comments  Decr lordosis , rounded shoulders.       ROM / Strength   AROM / PROM / Strength  AROM;Strength;PROM      AROM   AROM Assessment Site  Lumbar    Lumbar Flexion  70    Lumbar Extension  15    Lumbar - Right Side Bend  10    Lumbar - Left Side Bend  10      Strength   Overall Strength Comments  Abigail Guzman      Flexibility   Soft Tissue Assessment /Muscle Length  yes    Hamstrings  65 degrees bilaterally      Palpation   Palpation comment  No significant tenderness in lower back or glutes                Objective measurements completed on examination: See above findings.              PT Education - 09/01/18 1049    Education Details  POC , HEP , need to decr bending and twisting.     Person(s) Educated  Patient    Methods  Explanation;Tactile cues;Handout;Verbal cues    Comprehension  Verbalized understanding;Returned demonstration       PT Short  Term Goals - 09/01/18 1159      PT SHORT TERM GOAL #1   Title  She will be independent with initial HEP    Time  2    Period  Weeks    Status  New      PT SHORT TERM GOAL #2   Title  She will report pain in back decr 30% or more with walking    Time  3    Period  Weeks    Status  New      PT SHORT TERM GOAL #3   Title  She will report asareness of good posture and limiting bending and twisting    Time  3    Period  Weeks    Status  New        PT Long Term Goals - 09/01/18 1201      PT LONG TERM GOAL #1   Title  She will be indendent with all HE{P issued    Time  6    Period  Weeks    Status  New      PT LONG TERM GOAL #2   Title  She will report Abigail pain/burn decr 75% or more    Time  6    Period  Weeks    Status  New      PT LONG TERM GOAL #3   Title  She will report return to walking for exercise able to walk 1 mile or more with 2-3 max pain    Time  6    Period  Weeks    Status  New      PT LONG TERM GOAL #4   Title  She will be able to sit for 60 min without incr pain with back support.     Time  6  Period  Weeks    Status  New      PT LONG TERM GOAL #5   Title  FOTO score will improve to 40% limited to show improved mobility with less pain    Time  6    Period  Weeks    Status  New             Plan - 09/01/18 1051    Clinical Impression Statement  Abigail Guzman presents with chronic back pain and now radicular symptoms into both legs RT< Lt.   She remains active but walking and bening activity incr pain an standing de r pain;   Early AM pain is increased.     SShe has some postureal changes.   It appears she has some long term degenerative changes  than may be irritating her nerves.  She may improve with PT .     History and Personal Factors relevant to plan of care:  increased leg symptoms now to LT  ,  limited activity due to pain. , weight gain,     Clinical Presentation  Evolving    Clinical Decision Making  Moderate    Rehab Potential  Good     Clinical Impairments Affecting Rehab Potential  chronicity , significant degenerative changes.  previous episodes of LBP    PT Frequency  2x / week    PT Duration  6 weeks    PT Treatment/Interventions  Cryotherapy;Electrical Stimulation;Traction;Manual techniques;Passive range of motion;Dry needling;Patient/family education;Therapeutic exercise;Therapeutic activities       Patient will benefit from skilled therapeutic intervention in order to improve the following deficits and impairments:  Pain, Postural dysfunction, Increased muscle spasms, Decreased range of motion, Decreased activity tolerance, Difficulty walking  Visit Diagnosis: Midline low back pain, unspecified chronicity, with sciatica presence unspecified  Muscle spasm of back     Problem List Patient Active Problem List   Diagnosis Date Noted  . Left ovarian cyst 04/12/2017    Darrel Hoover  PT 09/01/2018, 12:09 PM  [facility] [address] [place], [place], [address] Phone: [phone]   Fax:  [phone]  Name: Abigail Guzman MRN: [id number] Date of Birth: 1946-08-11

## 2018-09-01 NOTE — Patient Instructions (Signed)
Issued PPT , LTR,  Bridge, 2x/day 5-10 reps 5-10 sec hold and prone on elbows x 1-5 min  Cued to stop if leg symptoms incr

## 2018-09-07 ENCOUNTER — Encounter: Payer: Self-pay | Admitting: Cardiovascular Disease

## 2018-09-07 ENCOUNTER — Ambulatory Visit (INDEPENDENT_AMBULATORY_CARE_PROVIDER_SITE_OTHER): Payer: Medicare Other | Admitting: Cardiovascular Disease

## 2018-09-07 VITALS — BP 122/86 | HR 58 | Ht 65.0 in | Wt 214.2 lb

## 2018-09-07 DIAGNOSIS — R001 Bradycardia, unspecified: Secondary | ICD-10-CM | POA: Diagnosis not present

## 2018-09-07 DIAGNOSIS — E7849 Other hyperlipidemia: Secondary | ICD-10-CM

## 2018-09-07 DIAGNOSIS — I10 Essential (primary) hypertension: Secondary | ICD-10-CM

## 2018-09-07 DIAGNOSIS — Z23 Encounter for immunization: Secondary | ICD-10-CM | POA: Diagnosis not present

## 2018-09-07 NOTE — Patient Instructions (Addendum)

## 2018-09-09 ENCOUNTER — Encounter

## 2018-09-12 ENCOUNTER — Ambulatory Visit: Payer: Medicare Other

## 2018-09-12 DIAGNOSIS — M545 Low back pain: Secondary | ICD-10-CM

## 2018-09-12 DIAGNOSIS — M62830 Muscle spasm of back: Secondary | ICD-10-CM

## 2018-09-12 NOTE — Therapy (Signed)
[place], [place], [address] Phone: [phone]   Fax:  [phone]  Physical Therapy Treatment  Patient Details  Name: Abigail Guzman MRN: [id number] Date of Birth: 1946/09/28 Referring Provider (PT): Jean Rosenthal, MD   Encounter Date: 09/12/2018  PT End of Session - 09/12/18 1333    Visit Number  2    Number of Visits  12    Date for PT Re-Evaluation  10/14/18    Authorization Type  MCR    Authorization Time Period  Kx visit 15       Progress visit 10    PT Start Time  1234    PT Stop Time  1325    PT Time Calculation (min)  51 min    Activity Tolerance  Patient tolerated treatment well    Behavior During Therapy  [facility] for tasks assessed/performed       Past Medical History:  Diagnosis Date  . Arthritis   . Bradycardia   . GERD (gastroesophageal reflux disease)   . Hiatal hernia   . Hyperlipidemia   . Joint pain   . Left ovarian cyst 04/12/2017  . Night sweats   . Palpitation   . Prediabetes     Past Surgical History:  Procedure Laterality Date  . ABDOMINAL HYSTERECTOMY    . COSMETIC SURGERY      There were no vitals filed for this visit.  Subjective Assessment - 09/12/18 1335    Subjective  Doing HEp . Doing about the same    Pain Score  6     Pain Location  Back    Pain Orientation  Right;Left;Lower    Pain Descriptors / Indicators  Stabbing    Pain Radiating Towards  burn sensation to dorsum of feet    Pain Onset  More than a month ago    Pain Frequency  Constant                       [facility] Adult PT Treatment/Exercise - 09/12/18 0001      Exercises   Exercises  Knee/Hip;Lumbar      Lumbar Exercises: Stretches   Single Knee to Chest Stretch  Right;Left;2 reps;20 seconds    Lower Trunk Rotation  10 seconds;5 reps    Lower Trunk Rotation Limitations  RT/LT    Pelvic Tilt  10 reps;5 seconds      Lumbar Exercises: Seated   Other Seated Lumbar  Exercises  row x 12 red band       Lumbar Exercises: Supine   Bridge  10 reps    Other Supine Lumbar Exercises  part sit up x 10      Knee/Hip Exercises: Sidelying   Clams  RT/Lt x 10      Knee/Hip Exercises: Prone   Other Prone Exercises  quadraped x 8 leg extension RT/LT and press up  x 5.    Other Prone Exercises  piriformis strength x 10  bilateral foot press i      Modalities   Modalities  Moist Heat      Moist Heat Therapy   Number Minutes Moist Heat  10 Minutes    Moist Heat Location  Lumbar Spine      Manual Therapy   Manual Therapy  Soft tissue mobilization;Joint mobilization    Joint Mobilization  PA GR 2-3 fro lower lumbar to upper thoracic  PT Short Term Goals - 09/01/18 1159      PT SHORT TERM GOAL #1   Title  She will be independent with initial HEP    Time  2    Period  Weeks    Status  New      PT SHORT TERM GOAL #2   Title  She will report pain in back decr 30% or more with walking    Time  3    Period  Weeks    Status  New      PT SHORT TERM GOAL #3   Title  She will report asareness of good posture and limiting bending and twisting    Time  3    Period  Weeks    Status  New        PT Long Term Goals - 09/01/18 1201      PT LONG TERM GOAL #1   Title  She will be indendent with all HE{P issued    Time  6    Period  Weeks    Status  New      PT LONG TERM GOAL #2   Title  She will report LE pain/burn decr 75% or more    Time  6    Period  Weeks    Status  New      PT LONG TERM GOAL #3   Title  She will report return to walking for exercise able to walk 1 mile or more with 2-3 max pain    Time  6    Period  Weeks    Status  New      PT LONG TERM GOAL #4   Title  She will be able to sit for 60 min without incr pain with back support.     Time  6    Period  Weeks    Status  New      PT LONG TERM GOAL #5   Title  FOTO score will improve to 40% limited to show improved mobility with less pain    Time  6     Period  Weeks    Status  New            Plan - 09/12/18 1333    PT Treatment/Interventions  Cryotherapy;Electrical Stimulation;Traction;Manual techniques;Passive range of motion;Dry needling;Patient/family education;Therapeutic exercise;Therapeutic activities    PT Next Visit Plan  progress exercises , modalities as needed    Consulted and Agree with Plan of Care  Patient       Patient will benefit from skilled therapeutic intervention in order to improve the following deficits and impairments:  Pain, Postural dysfunction, Increased muscle spasms, Decreased range of motion, Decreased activity tolerance, Difficulty walking  Visit Diagnosis: Midline low back pain, unspecified chronicity, with sciatica presence unspecified  Muscle spasm of back     Problem List Patient Active Problem List   Diagnosis Date Noted  . Left ovarian cyst 04/12/2017    Darrel Hoover  PT 09/12/2018, 2:15 PM  [facility] [address] [place], [place], [address] Phone: [phone]   Fax:  [phone]  Name: Abigail Guzman MRN: [id number] Date of Birth: 07-31-1946

## 2018-09-15 ENCOUNTER — Ambulatory Visit: Payer: Medicare Other | Attending: Orthopaedic Surgery

## 2018-09-15 ENCOUNTER — Encounter

## 2018-09-15 DIAGNOSIS — M62830 Muscle spasm of back: Secondary | ICD-10-CM | POA: Insufficient documentation

## 2018-09-15 DIAGNOSIS — G8929 Other chronic pain: Secondary | ICD-10-CM | POA: Diagnosis not present

## 2018-09-15 DIAGNOSIS — M545 Low back pain: Secondary | ICD-10-CM | POA: Insufficient documentation

## 2018-09-15 NOTE — Therapy (Signed)
[place], [place], [address] Phone: [phone]   Fax:  [phone]  Physical Therapy Treatment  Patient Details  Name: Abigail Guzman MRN: [id number] Date of Birth: 1946-06-17 Referring Provider (PT): Jean Rosenthal, MD   Encounter Date: 09/15/2018  PT End of Session - 09/15/18 1059    Visit Number  3    Number of Visits  12    Date for PT Re-Evaluation  10/14/18    Authorization Type  MCR    Authorization Time Period  Kx visit 15       Progress visit 10    PT Start Time  1100    PT Stop Time  1145    PT Time Calculation (min)  45 min    Activity Tolerance  Patient tolerated treatment well;No increased pain    Behavior During Therapy  WFL for tasks assessed/performed       Past Medical History:  Diagnosis Date  . Arthritis   . Bradycardia   . GERD (gastroesophageal reflux disease)   . Hiatal hernia   . Hyperlipidemia   . Joint pain   . Left ovarian cyst 04/12/2017  . Night sweats   . Palpitation   . Prediabetes     Past Surgical History:  Procedure Laterality Date  . ABDOMINAL HYSTERECTOMY    . COSMETIC SURGERY      There were no vitals filed for this visit.  Subjective Assessment - 09/15/18 1104    Subjective  Sore into evening after last session    Currently in Pain?  Yes    Pain Score  6     Pain Location  Back    Pain Orientation  Right;Left;Lower    Pain Type  Chronic pain    Pain Onset  More than a month ago    Pain Frequency  Constant                       [facility] Adult PT Treatment/Exercise - 09/15/18 0001      Exercises   Exercises  Lumbar      Lumbar Exercises: Stretches   Single Knee to Chest Stretch  Right;Left;2 reps;20 seconds    Lower Trunk Rotation  10 seconds;5 reps    Lower Trunk Rotation Limitations  RT/LT    Pelvic Tilt  15 reps;10 seconds      Lumbar Exercises: Supine   Bent Knee Raise  15 reps    Bent Knee Raise Limitations   RT and LT cued to engage abdominals    Other Supine Lumbar Exercises  alt knee drops RT/LT x 15 with ab set    Other Supine Lumbar Exercises  reach to ankjles with head shoulder raise x 10       Lumbar Exercises: Sidelying   Clam  Right;Left;15 reps;2 seconds      Knee/Hip Exercises: Sidelying   Clams  RT/Lt x 15      Moist Heat Therapy   Number Minutes Moist Heat  10 Minutes    Moist Heat Location  Lumbar Spine      Manual Therapy   Manual Therapy  Manual Traction               PT Short Term Goals - 09/01/18 1159      PT SHORT TERM GOAL #1   Title  She will be independent with initial HEP    Time  2    Period  Weeks  Status  New      PT SHORT TERM GOAL #2   Title  She will report pain in back decr 30% or more with walking    Time  3    Period  Weeks    Status  New      PT SHORT TERM GOAL #3   Title  She will report asareness of good posture and limiting bending and twisting    Time  3    Period  Weeks    Status  New        PT Long Term Goals - 09/01/18 1201      PT LONG TERM GOAL #1   Title  She will be indendent with all HE{P issued    Time  6    Period  Weeks    Status  New      PT LONG TERM GOAL #2   Title  She will report LE pain/burn decr 75% or more    Time  6    Period  Weeks    Status  New      PT LONG TERM GOAL #3   Title  She will report return to walking for exercise able to walk 1 mile or more with 2-3 max pain    Time  6    Period  Weeks    Status  New      PT LONG TERM GOAL #4   Title  She will be able to sit for 60 min without incr pain with back support.     Time  6    Period  Weeks    Status  New      PT LONG TERM GOAL #5   Title  FOTO score will improve to 40% limited to show improved mobility with less pain    Time  6    Period  Weeks    Status  New            Plan - 09/15/18 1059    Clinical Impression Statement  No changes and more sore after last session so limited exercise to flexion exercises   today  and assess soreness next session    PT Treatment/Interventions  Cryotherapy;Electrical Stimulation;Traction;Manual techniques;Passive range of motion;Dry needling;Patient/family education;Therapeutic exercise;Therapeutic activities    PT Next Visit Plan  progress exercises , modalities as needed, manual, goals    PT Home Exercise Plan  PPT , LTR , bridge, prone on elbows    Consulted and Agree with Plan of Care  Patient       Patient will benefit from skilled therapeutic intervention in order to improve the following deficits and impairments:  Pain, Postural dysfunction, Increased muscle spasms, Decreased range of motion, Decreased activity tolerance, Difficulty walking  Visit Diagnosis: Muscle spasm of back  Chronic bilateral low back pain, unspecified whether sciatica present     Problem List Patient Active Problem List   Diagnosis Date Noted  . Left ovarian cyst 04/12/2017    Darrel Hoover  PT 09/15/2018, 12:07 PM  [facility] [address] [place], [place], [address] Phone: [phone]   Fax:  [phone]  Name: Abigail Guzman MRN: [id number] Date of Birth: [birth year]

## 2018-09-19 ENCOUNTER — Ambulatory Visit: Payer: Medicare Other

## 2018-09-19 DIAGNOSIS — G8929 Other chronic pain: Secondary | ICD-10-CM

## 2018-09-19 DIAGNOSIS — M62830 Muscle spasm of back: Secondary | ICD-10-CM

## 2018-09-19 DIAGNOSIS — M545 Low back pain: Secondary | ICD-10-CM | POA: Diagnosis not present
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
## 2018-09-21 ENCOUNTER — Encounter: Payer: Self-pay | Admitting: Physical Therapy

## 2018-09-21 ENCOUNTER — Ambulatory Visit: Payer: Medicare Other | Admitting: Physical Therapy

## 2018-09-21 DIAGNOSIS — M545 Low back pain: Principal | ICD-10-CM

## 2018-09-21 DIAGNOSIS — M62830 Muscle spasm of back: Secondary | ICD-10-CM | POA: Diagnosis not present

## 2018-09-21 DIAGNOSIS — G8929 Other chronic pain: Secondary | ICD-10-CM

## 2018-09-21 NOTE — Therapy (Signed)
Abigail Guzman, [place], [address] Phone: [phone]   Fax:  [phone]  Physical Therapy Treatment  Patient Details  Name: Abigail Guzman MRN: [id number] Date of Birth: [birth year] Referring Provider (PT): Jean Rosenthal, MD   Encounter Date: 09/21/2018  PT End of Session - 09/21/18 0940    Visit Number  5    Number of Visits  12    Date for PT Re-Evaluation  10/14/18    Authorization Type  MCR    Authorization Time Period  Kx visit 15       Progress visit 10    PT Start Time  0930    PT Stop Time  1015    PT Time Calculation (min)  45 min       Past Medical History:  Diagnosis Date  . Arthritis   . Bradycardia   . GERD (gastroesophageal reflux disease)   . Hiatal hernia   . Hyperlipidemia   . Joint pain   . Left ovarian cyst 04/12/2017  . Night sweats   . Palpitation   . Prediabetes     Past Surgical History:  Procedure Laterality Date  . ABDOMINAL HYSTERECTOMY    . COSMETIC SURGERY      There were no vitals filed for this visit.  Subjective Assessment - 09/21/18 0934    Subjective  Leg pain not as bad today. Did not take meds so I can feel what is happening.     Currently in Pain?  Yes    Pain Score  6     Pain Location  Buttocks    Pain Orientation  Right    Pain Descriptors / Indicators  Stabbing    Pain Type  Chronic pain                       [facility] Adult PT Treatment/Exercise - 09/21/18 0001      Exercises   Exercises  Lumbar      Lumbar Exercises: Stretches   Active Hamstring Stretch  2 reps;30 seconds    Lower Trunk Rotation  10 seconds;5 reps   with legs crossed   Lower Trunk Rotation Limitations  RT/LT    Hip Flexor Stretch  1 rep;Right;60 seconds    Hip Flexor Stretch Limitations  edge of mat     Quad Stretch  2 reps;30 seconds    Quad Stretch Limitations   prone, tightness noted bilateral     Piriformis Stretch  2 reps;30 seconds    Figure 4 Stretch  2 reps;30 seconds      Lumbar Exercises: Supine   Clam  20 reps    Clam Limitations  red band    Bridge  10 reps    Bridge with [employer]  10 reps    Other Supine Lumbar Exercises  ball squeeze with abdominal draw in       Moist Heat Therapy   Number Minutes Moist Heat  10 Minutes    Moist Heat Location  Lumbar Spine      Manual Therapy   Soft tissue mobilization  RT gluteals with hip rotation ; trigger point release with tennis ball right piriformis prone and sidelying             PT Education - 09/21/18 1045    Education Details  [employer] scientist (physical sciences)) Educated  Patient    Methods  Explanation;Handout    Comprehension  Verbalized understanding       PT Short Term Goals - 09/19/18 1012      PT SHORT TERM GOAL #1   Title  She will be independent with initial HEP    Status  Achieved      PT SHORT TERM GOAL #2   Title  She will report pain in back decr 30% or more with walking    Status  On-going      PT SHORT TERM GOAL #3   Title  She will report awareness of good posture and limiting bending and twisting    Baseline  she is abel to verbal ize this and assume extension when asked    Status  Achieved        PT Long Term Goals - 09/01/18 1201      PT LONG TERM GOAL #1   Title  She will be indendent with all HE{P issued    Time  6    Period  Weeks    Status  New      PT LONG TERM GOAL #2   Title  She will report LE pain/burn decr 75% or more    Time  6    Period  Weeks    Status  New      PT LONG TERM GOAL #3   Title  She will report return to walking for exercise able to walk 1 mile or more with 2-3 max pain    Time  6    Period  Weeks    Status  New      PT LONG TERM GOAL #4   Title  She will be able to sit for 60 min without incr pain with back support.     Time  6    Period  Weeks    Status  New      PT LONG TERM GOAL #5   Title  FOTO score will improve to 40% limited to show improved mobility with  less pain    Time  6    Period  Weeks    Status  New            Plan - 09/21/18 1052    Clinical Impression Statement  Pt arrives reporting a slight decrease in pain. She is trying not to take meds before PT.  Worked on lumbar/hip stabilization and hip stretches. Manual performed to right piriformis. Education provided on TPDN and she was scheduled with therapist to perform x 2 . Tighness noted in quads with prone lying.     PT Next Visit Plan  progress exercises , modalities as needed, manual, goals, add quad/hip flexor stretch to HEP     PT Home Exercise Plan  PPT , LTR , bridge, prone on elbows, tennis ball to gluts    Consulted and Agree with Plan of Care  Patient       Patient will benefit from skilled therapeutic intervention in order to improve the following deficits and impairments:  Pain, Postural dysfunction, Increased muscle spasms, Decreased range of motion, Decreased activity tolerance, Difficulty walking  Visit Diagnosis: Chronic bilateral low back pain, unspecified whether sciatica present  Muscle spasm of back     Problem List Patient Active Problem List   Diagnosis Date Noted  . Left ovarian cyst 04/12/2017    Abigail Guzman , PTA 09/21/2018, 10:55 AM  [facility] [address] [place], [place], [address] Phone: [phone]   Fax:  [phone]  Name: Abigail Guzman MRN: [id number] Date of Birth: 1946-04-15

## 2018-09-21 NOTE — Patient Instructions (Signed)

## 2018-09-26 ENCOUNTER — Ambulatory Visit: Payer: Medicare Other

## 2018-09-26 ENCOUNTER — Other Ambulatory Visit (INDEPENDENT_AMBULATORY_CARE_PROVIDER_SITE_OTHER): Payer: Self-pay | Admitting: Orthopaedic Surgery

## 2018-09-26 DIAGNOSIS — M545 Low back pain: Secondary | ICD-10-CM | POA: Diagnosis not present

## 2018-09-26 DIAGNOSIS — G8929 Other chronic pain: Secondary | ICD-10-CM | POA: Diagnosis not present

## 2018-09-26 DIAGNOSIS — M62830 Muscle spasm of back: Secondary | ICD-10-CM

## 2018-09-26 NOTE — Therapy (Signed)
[place], [place], [address] Phone: [phone]   Fax:  [phone]  Physical Therapy Treatment  Patient Details  Name: Abigail Guzman MRN: [id number] Date of Birth: May 11, 1946 Referring Provider (PT): Jean Rosenthal, MD   Encounter Date: 09/26/2018  PT End of Session - 09/26/18 0933    Visit Number  6    Number of Visits  12    Date for PT Re-Evaluation  10/14/18    Authorization Type  MCR    Authorization Time Period  Kx visit 15       Progress visit 10    PT Start Time  0932    PT Stop Time  1020    PT Time Calculation (min)  48 min    Activity Tolerance  Patient tolerated treatment well;No increased pain    Behavior During Therapy  WFL for tasks assessed/performed       Past Medical History:  Diagnosis Date  . Arthritis   . Bradycardia   . GERD (gastroesophageal reflux disease)   . Hiatal hernia   . Hyperlipidemia   . Joint pain   . Left ovarian cyst 04/12/2017  . Night sweats   . Palpitation   . Prediabetes     Past Surgical History:  Procedure Laterality Date  . ABDOMINAL HYSTERECTOMY    . COSMETIC SURGERY      There were no vitals filed for this visit.  Subjective Assessment - 09/26/18 0934    Subjective  No changes.   A couple of days some better last week then back to norm;   Hamstring stretching caused RT post thigh pain. so stopped. TO MD after PT.     Diagnostic tests  XraysL4-5 Bulge disc press on nerves    Pain Score  6     Pain Location  Buttocks   and ba ck    Pain Orientation  Right    Pain Descriptors / Indicators  Stabbing    Pain Type  Chronic pain    Pain Onset  More than a month ago    Pain Frequency  Constant    Aggravating Factors   walk/sit    Pain Relieving Factors  meds                       [facility] Adult PT Treatment/Exercise - 09/26/18 0001      Lumbar Exercises: Stretches   Single Knee to Chest Stretch  Right;1 rep;60  seconds    Lower Trunk Rotation  2 reps;20 seconds    Lower Trunk Rotation Limitations  to LT     Pelvic Tilt  10 reps    Other Lumbar Stretch Exercise  figuire 4 with hip ext fllat on bed with gluteal sets x 10       Lumbar Exercises: Aerobic   Nustep  L4 4 min LE and UE      Lumbar Exercises: Supine   Bridge  15 reps      Moist Heat Therapy   Number Minutes Moist Heat  10 Minutes    Moist Heat Location  Lumbar Spine      Manual Therapy   Joint Mobilization  PA GR 3-4 from lower lumbar to upper thoracic    Soft tissue mobilization  Lt side sacrum PA with deep breathing.     Manual Traction  GR 3-4 pulls RT leg x100 eps plus  PT Short Term Goals - 09/19/18 1012      PT SHORT TERM GOAL #1   Title  She will be independent with initial HEP    Status  Achieved      PT SHORT TERM GOAL #2   Title  She will report pain in back decr 30% or more with walking    Status  On-going      PT SHORT TERM GOAL #3   Title  She will report awareness of good posture and limiting bending and twisting    Baseline  she is abel to verbal ize this and assume extension when asked    Status  Achieved        PT Long Term Goals - 09/01/18 1201      PT LONG TERM GOAL #1   Title  She will be indendent with all HE{P issued    Time  6    Period  Weeks    Status  New      PT LONG TERM GOAL #2   Title  She will report LE pain/burn decr 75% or more    Time  6    Period  Weeks    Status  New      PT LONG TERM GOAL #3   Title  She will report return to walking for exercise able to walk 1 mile or more with 2-3 max pain    Time  6    Period  Weeks    Status  New      PT LONG TERM GOAL #4   Title  She will be able to sit for 60 min without incr pain with back support.     Time  6    Period  Weeks    Status  New      PT LONG TERM GOAL #5   Title  FOTO score will improve to 40% limited to show improved mobility with less pain    Time  6    Period  Weeks    Status  New             Plan - 09/26/18 0934    Clinical Impression Statement  no changes . Walks normal with good pace/speed.  Tender whole spine and sacrum and RT gluteals.  Will see x 2-3- and if no better discharge back to MD    Clinical Impairments Affecting Rehab Potential  chronicity , significant degenerative changes.  previous episodes of LBP    PT Treatment/Interventions  Cryotherapy;Electrical Stimulation;Traction;Manual techniques;Passive range of motion;Dry needling;Patient/family education;Therapeutic exercise;Therapeutic activities    PT Next Visit Plan  progress exercises , modalities as needed, manual, goals, add quad/hip flexor stretch to HEP  DN next session    PT Home Exercise Plan  PPT , LTR , bridge, prone on elbows, tennis ball to gluts    Consulted and Agree with Plan of Care  Patient       Patient will benefit from skilled therapeutic intervention in order to improve the following deficits and impairments:  Pain, Postural dysfunction, Increased muscle spasms, Decreased range of motion, Decreased activity tolerance, Difficulty walking  Visit Diagnosis: Chronic bilateral low back pain, unspecified whether sciatica present  Muscle spasm of back     Problem List Patient Active Problem List   Diagnosis Date Noted  . Left ovarian cyst 04/12/2017    Darrel Hoover  PT 09/26/2018, 10:18 AM  [place],  [place], [address] Phone: [phone]   Fax:  [phone]  Name: DEVONNE KITCHEN MRN: [id number] Date of Birth: July 14, 1946

## 2018-09-26 NOTE — Telephone Encounter (Signed)
Please advise 

## 2018-09-28 ENCOUNTER — Ambulatory Visit: Payer: Medicare Other | Admitting: Physical Therapy

## 2018-09-29 ENCOUNTER — Other Ambulatory Visit: Payer: Self-pay

## 2018-09-29 ENCOUNTER — Ambulatory Visit: Payer: Medicare Other | Admitting: Physical Therapy

## 2018-09-29 ENCOUNTER — Encounter: Payer: Self-pay | Admitting: Physical Therapy

## 2018-09-29 DIAGNOSIS — M545 Low back pain: Principal | ICD-10-CM

## 2018-09-29 DIAGNOSIS — M62830 Muscle spasm of back: Secondary | ICD-10-CM | POA: Diagnosis not present

## 2018-09-29 DIAGNOSIS — G8929 Other chronic pain: Secondary | ICD-10-CM | POA: Diagnosis not present

## 2018-09-29 NOTE — Therapy (Signed)
[place], [place], [address] Phone: [phone]   Fax:  [phone]  Physical Therapy Treatment  Patient Details  Name: Abigail Guzman MRN: [id number] Date of Birth: December 09, 1946 Referring Provider (PT): Jean Rosenthal, MD   Encounter Date: 09/29/2018  PT End of Session - 09/29/18 1121    Visit Number  7    Number of Visits  12    Date for PT Re-Evaluation  10/14/18    Authorization Type  MCR    Authorization Time Period  Kx visit 15       Progress visit 10    PT Start Time  0932    PT Stop Time  1025    PT Time Calculation (min)  53 min    Activity Tolerance  Patient tolerated treatment well;No increased pain    Behavior During Therapy  WFL for tasks assessed/performed       Past Medical History:  Diagnosis Date  . Arthritis   . Bradycardia   . GERD (gastroesophageal reflux disease)   . Hiatal hernia   . Hyperlipidemia   . Joint pain   . Left ovarian cyst 04/12/2017  . Night sweats   . Palpitation   . Prediabetes     Past Surgical History:  Procedure Laterality Date  . ABDOMINAL HYSTERECTOMY    . COSMETIC SURGERY      There were no vitals filed for this visit.  Subjective Assessment - 09/29/18 0935    Subjective  Minimal changes since evaluation and injections in back since March. I feel that my back pain has decreased about 30 % with exercise with walking and with the injections . What still kills me is inclines and stairs    Limitations  Walking   inclines and stairs   How long can you sit comfortably?  can sit with proper posture but not flexed in comfortable lounge chair    How long can you walk comfortably?  AS needed with meds   will be 5000 steps a day but not the 2 miles she did before back pain started    Diagnostic tests  XraysL4-5 Bulge disc press on nerves    Currently in Pain?  Yes    Pain Score  6     Pain Location  Buttocks   and back   Pain Orientation   Right    Pain Descriptors / Indicators  Stabbing    Pain Type  Chronic pain    Pain Onset  More than a month ago    Pain Frequency  Constant    Aggravating Factors   brisk walking and unable to sit in a lounge chair                       [facility] Adult PT Treatment/Exercise - 09/29/18 0940      Self-Care   Self-Care  Other Self-Care Comments    Other Self-Care Comments   education of TPDN and after care and precuatians      Exercises   Exercises  Lumbar      Lumbar Exercises: Stretches   Single Knee to Chest Stretch  Right;1 rep;60 seconds;Left    Single Knee to Chest Stretch Limitations  feels better after TPDN but no change in pain    Double Knee to Chest Stretch  2 reps;20 seconds   feels somewhat better with stretch and after TPDN   Lower Trunk Rotation  2 reps;20 seconds  Lower Trunk Rotation Limitations  to LT     Pelvic Tilt  10 reps    Lumbar Stabilization Level 1 Limitations  bridge 2 x 10 with  ball squeeze    Other Lumbar Stretch Exercise  left sidelying quadratus lumborum stretch for 3 -5 minutes     Other Lumbar Stretch Exercise  figuire 4 with hip ext fllat on bed with gluteal sets x 10       Lumbar Exercises: Supine   Ab Set  10 reps    Clam  20 reps    Clam Limitations  green t abnd    Bridge  10 reps   without ball     Moist Heat Therapy   Number Minutes Moist Heat  10 Minutes    Moist Heat Location  Lumbar Spine      Manual Therapy   Manual therapy comments  skilled palpation with TPDN    Joint Mobilization  PA GR 3-4 from lower lumbar to upper thoracic    Soft tissue mobilization  right gluteals and lumbar paraspinals and QL Right    Manual Traction  GR 3-4 pulls RT leg       Trigger Point Dry Needling - 09/29/18 0950    Consent Given?  Yes    Education Handout Provided  Yes    Muscles Treated Upper Body  Quadratus Lumborum;Longissimus   right  only   Muscles Treated Lower Body  Gluteus minimus;Gluteus maximus   right only    Longissimus Response  Twitch response elicited;Palpable increased muscle length    Gluteus Maximus Response  Twitch response elicited;Palpable increased muscle length    Gluteus Minimus Response  Twitch response elicited;Palpable increased muscle length           PT Education - 09/29/18 1001    Education Details  added quadratus lumborum stretch in sidelying  and positioning and education on TPDN after care and precatians    Person(s) Educated  Patient    Methods  Explanation;Demonstration;Verbal cues;Tactile cues;Handout    Comprehension  Verbalized understanding;Returned demonstration       PT Short Term Goals - 09/29/18 1001      PT SHORT TERM GOAL #1   Title  She will be independent with initial HEP    Time  2    Period  Weeks    Status  Achieved      PT SHORT TERM GOAL #2   Title  She will report pain in back decr 30% or more with walking    Baseline  Pt states she is 30% better since evaluation but still struggles on inclines and stairs with pain    Time  3    Period  Weeks    Status  Achieved      PT SHORT TERM GOAL #3   Title  She will report awareness of good posture and limiting bending and twisting    Baseline  she is abel to verbal ize this and assume extension when asked10-10-19    Time  3    Period  Weeks    Status  Achieved        PT Long Term Goals - 09/29/18 1002      PT LONG TERM GOAL #1   Title  She will be indendent with all HE{P issued    Time  6    Period  Weeks    Status  On-going      PT LONG TERM GOAL #2   Title  She will report LE pain/burn decr 75% or more    Baseline  Decreased 30% today with medicine 09-29-18    Time  6    Period  Weeks    Status  On-going      PT LONG TERM GOAL #3   Title  She will report return to walking for exercise able to walk 1 mile or more with 2-3 max pain    Baseline  only able to walk 1/4 mile    Time  6    Period  Weeks      PT LONG TERM GOAL #4   Title  She will be able to sit for 60 min  without incr pain with back support.     Baseline  45 to 60 minutes in good chair not lounge chair    Time  6    Period  Weeks    Status  On-going      PT LONG TERM GOAL #5   Title  FOTO score will improve to 40% limited to show improved mobility with less pain    Time  6    Period  Weeks    Status  Unable to assess            Plan - 09/29/18 1011    Clinical Impression Statement  minimal changes.  Pt reports that she is 30% better than eval STG# 2 achieved , but minimally better over all . Pt consented to TPDN and was closely monitored throughout session.  Pt had increased pain down right leg after supine clams and then in POE and knee flexion exercise in prone. decreased pain.  Pt is discouraged about ongoing pain.  will address next session with Pearson Forster about continuing PT    Clinical Impairments Affecting Rehab Potential  chronicity , significant degenerative changes.  previous episodes of LBP    PT Frequency  2x / week    PT Duration  6 weeks    PT Treatment/Interventions  Cryotherapy;Electrical Stimulation;Traction;Manual techniques;Passive range of motion;Dry needling;Patient/family education;Therapeutic exercise;Therapeutic activities    PT Next Visit Plan  FOTO next visit  progress exercises , modalities as needed, manual, goals, assess TPDN  possible DC in next 1-2 visit due to minimal progress    PT Home Exercise Plan  PPT , LTR , bridge, prone on elbows, tennis ball to gluts    Consulted and Agree with Plan of Care  Patient       Patient will benefit from skilled therapeutic intervention in order to improve the following deficits and impairments:  Pain, Postural dysfunction, Increased muscle spasms, Decreased range of motion, Decreased activity tolerance, Difficulty walking  Visit Diagnosis: Chronic bilateral low back pain, unspecified whether sciatica present  Muscle spasm of back     Problem List Patient Active Problem List   Diagnosis Date Noted  . Left  ovarian cyst 04/12/2017   Voncille Lo, PT Certified Exercise Expert for the Aging Adult  09/29/18 11:22 AM Phone: [phone] Fax: [facility] [address] [place], [place], [address] Phone: [phone]   Fax:  [phone]  Name: EVONE ARSENEAU MRN: [id number] Date of Birth: [birth year]

## 2018-09-29 NOTE — Patient Instructions (Addendum)
Trigger Point Dry Needling went over with Pt a seconds  . What is Trigger Point Dry Needling (DN)? o DN is a physical therapy technique used to treat muscle pain and dysfunction. Specifically, DN helps deactivate muscle trigger points (muscle knots).  o A thin filiform needle is used to penetrate the skin and stimulate the underlying trigger point. The goal is for a local twitch response (LTR) to occur and for the trigger point to relax. No medication of any kind is injected during the procedure.   . What Does Trigger Point Dry Needling Feel Like?  o The procedure feels different for each individual patient. Some patients report that they do not actually feel the needle enter the skin and overall the process is not painful. Very mild bleeding may occur. However, many patients feel a deep cramping in the muscle in which the needle was inserted. This is the local twitch response.   Marland Kitchen How Will I feel after the treatment? o Soreness is normal, and the onset of soreness may not occur for a few hours. Typically this soreness does not last longer than two days.  o Bruising is uncommon, however; ice can be used to decrease any possible bruising.  o In rare cases feeling tired or nauseous after the treatment is normal. In addition, your symptoms may get worse before they get better, this period will typically not last longer than 24 hours.   . What Can I do After My Treatment? o Increase your hydration by drinking more water for the next 24 hours. o You may place ice or heat on the areas treated that have become sore, however, do not use heat on inflamed or bruised areas. Heat often brings more relief post needling. o You can continue your regular activities, but vigorous activity is not recommended initially after the treatment for 24 hours. o DN is best combined with other physical therapy such as strengthening, stretching, and other therapies.   Use handout for Quadratus Lumborum for education  information and left sidelying for 3 - 27minutes    Voncille Lo, PT Certified Exercise Expert for the Aging Adult  09/29/18 9:44 AM Phone: [phone] Fax: [phone]

## 2018-10-03 ENCOUNTER — Ambulatory Visit: Payer: Medicare Other

## 2018-10-03 DIAGNOSIS — M62830 Muscle spasm of back: Secondary | ICD-10-CM | POA: Diagnosis not present

## 2018-10-03 DIAGNOSIS — M545 Low back pain, unspecified: Secondary | ICD-10-CM

## 2018-10-03 DIAGNOSIS — G8929 Other chronic pain: Secondary | ICD-10-CM

## 2018-10-03 NOTE — Therapy (Signed)
[place], [place], [address] Phone: [phone]   Fax:  [phone]  Physical Therapy Treatment  Patient Details  Name: Abigail Guzman MRN: [id number] Date of Birth: 12-04-1946 Referring Provider (PT): Jean Rosenthal, MD  Progress Note Reporting Period 09/01/18 to 10/03/18  See note below for Objective Data and Assessment of Progress/Goals.      Encounter Date: 10/03/2018  PT End of Session - 10/03/18 0944    Visit Number  8    Number of Visits  12    Date for PT Re-Evaluation  10/14/18    Authorization Type  MCR    Authorization Time Period  Kx visit 15       Progress visit 10    PT Start Time  0935    PT Stop Time  1015    PT Time Calculation (min)  40 min    Activity Tolerance  Patient tolerated treatment well;No increased pain    Behavior During Therapy  WFL for tasks assessed/performed       Past Medical History:  Diagnosis Date  . Arthritis   . Bradycardia   . GERD (gastroesophageal reflux disease)   . Hiatal hernia   . Hyperlipidemia   . Joint pain   . Left ovarian cyst 04/12/2017  . Night sweats   . Palpitation   . Prediabetes     Past Surgical History:  Procedure Laterality Date  . ABDOMINAL HYSTERECTOMY    . COSMETIC SURGERY      There were no vitals filed for this visit.  Subjective Assessment - 10/03/18 0936    Subjective  Think I'm a tad better.   She feels she was able to go most of day without meds.  She planted flowers and hurt after.  heat on knee  daily and felt good.  She took it easy Thursday and Friday and used stool for planting.     Feels PT exercises help some.   Pain is less sharp.   DN was painful but felt better later .   Incr sleep number in bed and helpful,         Limitations  Walking    Pain Score  --   5.5   Pain Location  Buttocks   and back   Pain Orientation  Right    Pain Descriptors / Indicators  Stabbing    Pain Type  Chronic pain     Pain Onset  More than a month ago    Pain Frequency  Constant    Pain Relieving Factors  meds         [facility] PT Assessment - 10/03/18 0001      Observation/Other Assessments   Focus on Therapeutic Outcomes (FOTO)   60% limited                   [facility] Adult PT Treatment/Exercise - 10/03/18 0001      Lumbar Exercises: Stretches   Single Knee to Chest Stretch  Right;2 reps;30 seconds    Single Knee to Chest Stretch Limitations  and to Lt shoulfder x2 30 sec     Lower Trunk Rotation  2 reps;20 seconds    Lower Trunk Rotation Limitations  to LT    Pelvic Tilt  10 reps    Other Lumbar Stretch Exercise  figuire 4 with hip ext fllat on bed with gluteal sets x 10       Lumbar Exercises: Supine   Ab  Set  10 reps    Clam  10 reps    Clam Limitations  RT and LT leg drop and control without pelvic movement      Lumbar Exercises: Sidelying   Clam  Right;15 reps      Manual Therapy   Soft tissue mobilization  right gluteals and lumbar paraspinals and QL Right    Manual Traction  GR 3-4 pulls RT leg               PT Short Term Goals - 09/29/18 1001      PT SHORT TERM GOAL #1   Title  She will be independent with initial HEP    Time  2    Period  Weeks    Status  Achieved      PT SHORT TERM GOAL #2   Title  She will report pain in back decr 30% or more with walking    Baseline  Pt states she is 30% better since evaluation but still struggles on inclines and stairs with pain    Time  3    Period  Weeks    Status  Achieved      PT SHORT TERM GOAL #3   Title  She will report awareness of good posture and limiting bending and twisting    Baseline  she is abel to verbal ize this and assume extension when asked10-10-19    Time  3    Period  Weeks    Status  Achieved        PT Long Term Goals - 09/29/18 1002      PT LONG TERM GOAL #1   Title  She will be indendent with all HE{P issued    Time  6    Period  Weeks    Status  On-going      PT LONG TERM  GOAL #2   Title  She will report LE pain/burn decr 75% or more    Baseline  Decreased 30% today with medicine 09-29-18    Time  6    Period  Weeks    Status  On-going      PT LONG TERM GOAL #3   Title  She will report return to walking for exercise able to walk 1 mile or more with 2-3 max pain    Baseline  only able to walk 1/4 mile    Time  6    Period  Weeks      PT LONG TERM GOAL #4   Title  She will be able to sit for 60 min without incr pain with back support.     Baseline  45 to 60 minutes in good chair not lounge chair    Time  6    Period  Weeks    Status  On-going      PT LONG TERM GOAL #5   Title  FOTO score will improve to 40% limited to show improved mobility with less pain    Time  6    Period  Weeks    Status  Unable to assess            Plan - 10/03/18 0945    Clinical Impression Statement  She reports improvement but this is small.    She reported DN helpful.  Encouraged her to not do activity that increwase pain significnatly or does not go away shortly after activity. FOTO indicates she is worse.   Will do DN x  1 and follow up after and decide then on DC or cont    PT Treatment/Interventions  Cryotherapy;Electrical Stimulation;Traction;Manual techniques;Passive range of motion;Dry needling;Patient/family education;Therapeutic exercise;Therapeutic activities    PT Next Visit Plan    progress exercises , modalities as needed, manual, goals, assess TPDN  possible DC in next 1-2 visit due to minimal progress    PT Home Exercise Plan  PPT , LTR , bridge, prone on elbows, tennis ball to gluts    Consulted and Agree with Plan of Care  Patient       Patient will benefit from skilled therapeutic intervention in order to improve the following deficits and impairments:  Pain, Postural dysfunction, Increased muscle spasms, Decreased range of motion, Decreased activity tolerance, Difficulty walking  Visit Diagnosis: Chronic bilateral low back pain, unspecified  whether sciatica present  Muscle spasm of back     Problem List Patient Active Problem List   Diagnosis Date Noted  . Left ovarian cyst 04/12/2017    Darrel Hoover  PT 10/03/2018, 10:19 AM  [facility] [address] [place], [place], [address] Phone: [phone]   Fax:  [phone]  Name: Abigail Guzman MRN: [id number] Date of Birth: Aug 28, 1946

## 2018-10-05 ENCOUNTER — Encounter: Payer: Medicare Other | Admitting: Physical Therapy

## 2018-10-06 ENCOUNTER — Ambulatory Visit: Payer: Medicare Other | Admitting: Physical Therapy

## 2018-10-06 DIAGNOSIS — M62830 Muscle spasm of back: Secondary | ICD-10-CM

## 2018-10-06 DIAGNOSIS — M545 Low back pain: Principal | ICD-10-CM

## 2018-10-06 DIAGNOSIS — G8929 Other chronic pain: Secondary | ICD-10-CM | POA: Diagnosis not present

## 2018-10-06 NOTE — Therapy (Signed)
[place], [place], [address] Phone: [phone]   Fax:  [phone]  Physical Therapy Treatment  Patient Details  Name: Abigail Guzman MRN: [id number] Date of Birth: 08-07-1946 Referring Provider (PT): Jean Rosenthal, MD   Encounter Date: 10/06/2018  PT End of Session - 10/06/18 1025    Visit Number  9    Number of Visits  12    Date for PT Re-Evaluation  10/14/18    Authorization Type  MCR    PT Start Time  0935    PT Stop Time  1030    PT Time Calculation (min)  55 min    Activity Tolerance  Patient tolerated treatment well;No increased pain    Behavior During Therapy  WFL for tasks assessed/performed       Past Medical History:  Diagnosis Date  . Arthritis   . Bradycardia   . GERD (gastroesophageal reflux disease)   . Hiatal hernia   . Hyperlipidemia   . Joint pain   . Left ovarian cyst 04/12/2017  . Night sweats   . Palpitation   . Prediabetes     Past Surgical History:  Procedure Laterality Date  . ABDOMINAL HYSTERECTOMY    . COSMETIC SURGERY      There were no vitals filed for this visit.  Subjective Assessment - 10/06/18 0940    Subjective  the first thing when I get up I feel like a knife that goes through my back/buttocks.  after I move around .  I was feeling a bit better after TPDN and exercises.  One morning I did not feel that bad  It feels like it will never truly go away.  Stairs are my enemy    How long can you sit comfortably?  can sit with proper posture but not flexed in comfortable lounge chair    Diagnostic tests  XraysL4-5 Bulge disc press on nerves    Currently in Pain?  Yes    Pain Score  6     Pain Location  Buttocks                       [facility] Adult PT Treatment/Exercise - 10/06/18 0942      Exercises   Exercises  Lumbar      Lumbar Exercises: Stretches   Single Knee to Chest Stretch  Right;2 reps;30 seconds   LEft side 30 sec  x 1   Lower Trunk Rotation  2 reps;20 seconds   bil   Pelvic Tilt  10 reps    Lumbar Stabilization Level 1 Limitations  bridge 2 x 10 with  ball squeeze    Quad Stretch  2 reps;30 seconds    Quad Stretch Limitations  prone    Other Lumbar Stretch Exercise  figuire 4 with hip ext fllat on bed with gluteal sets x 10       Lumbar Exercises: Supine   Ab Set  10 reps    Clam  10 reps    Clam Limitations  VC for breathing and mx control and with red t band      Lumbar Exercises: Prone   Other Prone Lumbar Exercises  childs pose position x 3  hold 10 sec.  knees not able to handle position    Other Prone Lumbar Exercises  quad stretch with green t band x 5 hold 10 sec bil      Moist Heat Therapy   Number  Minutes Moist Heat  13 Minutes    Moist Heat Location  Lumbar Spine      Manual Therapy   Manual therapy comments  skilled palpation with TPDN    Joint Mobilization  PA GR 3-4 from lower lumbar to upper thoracic    Soft tissue mobilization  right gluteals and lumbar paraspinals and QL Right    Manual Traction  right AP mobs of right hip and IR/ER with piriformis palpation        Trigger Point Dry Needling - 10/06/18 1000    Consent Given?  Yes    Education Handout Provided  No   previously given   Muscles Treated Upper Body  Quadratus Lumborum;Longissimus   L-5 L4 right side only   Muscles Treated Lower Body  Gluteus minimus;Gluteus maximus;Piriformis   right only   Longissimus Response  Twitch response elicited;Palpable increased muscle length    Gluteus Maximus Response  Twitch response elicited;Palpable increased muscle length    Gluteus Minimus Response  Twitch response elicited;Palpable increased muscle length    Piriformis Response  Twitch response elicited;Palpable increased muscle length             PT Short Term Goals - 09/29/18 1001      PT SHORT TERM GOAL #1   Title  She will be independent with initial HEP    Time  2    Period  Weeks    Status  Achieved       PT SHORT TERM GOAL #2   Title  She will report pain in back decr 30% or more with walking    Baseline  Pt states she is 30% better since evaluation but still struggles on inclines and stairs with pain    Time  3    Period  Weeks    Status  Achieved      PT SHORT TERM GOAL #3   Title  She will report awareness of good posture and limiting bending and twisting    Baseline  she is abel to verbal ize this and assume extension when asked10-10-19    Time  3    Period  Weeks    Status  Achieved        PT Long Term Goals - 09/29/18 1002      PT LONG TERM GOAL #1   Title  She will be indendent with all HE{P issued    Time  6    Period  Weeks    Status  On-going      PT LONG TERM GOAL #2   Title  She will report LE pain/burn decr 75% or more    Baseline  Decreased 30% today with medicine 09-29-18    Time  6    Period  Weeks    Status  On-going      PT LONG TERM GOAL #3   Title  She will report return to walking for exercise able to walk 1 mile or more with 2-3 max pain    Baseline  only able to walk 1/4 mile    Time  6    Period  Weeks      PT LONG TERM GOAL #4   Title  She will be able to sit for 60 min without incr pain with back support.     Baseline  45 to 60 minutes in good chair not lounge chair    Time  6    Period  Weeks    Status  On-going      PT LONG TERM GOAL #5   Title  FOTO score will improve to 40% limited to show improved mobility with less pain    Time  6    Period  Weeks    Status  Unable to assess            Plan - 10/06/18 1025    Clinical Impression Statement  Pt reports she was doing much better but today she is 6/10 and has sharp pain through right buttock.  Pt consented to TPDN and was closely monitored throughout session. Pt reports improved pain after RX and exercise . Pt reports decreased to 5/10 pain from 6/10 but more flexibiity.    Rehab Potential  Good    Clinical Impairments Affecting Rehab Potential  chronicity , significant  degenerative changes.  previous episodes of LBP    PT Frequency  2x / week    PT Duration  6 weeks    PT Treatment/Interventions  Cryotherapy;Electrical Stimulation;Traction;Manual techniques;Passive range of motion;Dry needling;Patient/family education;Therapeutic exercise;Therapeutic activities    PT Next Visit Plan    progress exercises , possible DC in next  visit due to minimal progress.  Pt with Pearson Forster for DC next visit    PT Home Exercise Plan  PPT , LTR , bridge, prone on elbows, tennis ball to gluts    Consulted and Agree with Plan of Care  Patient       Patient will benefit from skilled therapeutic intervention in order to improve the following deficits and impairments:  Pain, Postural dysfunction, Increased muscle spasms, Decreased range of motion, Decreased activity tolerance, Difficulty walking  Visit Diagnosis: Chronic bilateral low back pain, unspecified whether sciatica present  Muscle spasm of back     Problem List Patient Active Problem List   Diagnosis Date Noted  . Left ovarian cyst 04/12/2017   Voncille Lo, PT Certified Exercise Expert for the Aging Adult  10/06/18 10:39 AM Phone: [phone] Fax: [place] [facility] [address] [place], [place], [address] Phone: [phone]   Fax:  [phone]  Name: Abigail Guzman MRN: [id number] Date of Birth: 02/15/1946

## 2018-10-11 ENCOUNTER — Ambulatory Visit: Payer: Medicare Other

## 2018-10-11 DIAGNOSIS — M545 Low back pain, unspecified: Secondary | ICD-10-CM

## 2018-10-11 DIAGNOSIS — G8929 Other chronic pain: Secondary | ICD-10-CM | POA: Diagnosis not present

## 2018-10-11 DIAGNOSIS — M62830 Muscle spasm of back: Secondary | ICD-10-CM | POA: Diagnosis not present

## 2018-10-11 NOTE — Therapy (Signed)
[place], [place], [address] Phone: [phone]   Fax:  [phone]  Physical Therapy Treatment  Patient Details  Name: Abigail Guzman MRN: [id number] Date of Birth: 13-Aug-1946 Referring Provider (PT): Jean Rosenthal, MD  Progress Note Reporting Period 09/01/18 to 10/10/18  See note below for Objective Data and Assessment of Progress/Goals.      Encounter Date: 10/11/2018  PT End of Session - 10/11/18 1024    Visit Number  10    Number of Visits  12    Date for PT Re-Evaluation  10/14/18    Authorization Type  MCR    Authorization Time Period  Kx visit 15       Progress visit 10    PT Start Time  1015    PT Stop Time  1100    PT Time Calculation (min)  45 min    Activity Tolerance  Patient tolerated treatment well;No increased pain    Behavior During Therapy  WFL for tasks assessed/performed       Past Medical History:  Diagnosis Date  . Arthritis   . Bradycardia   . GERD (gastroesophageal reflux disease)   . Hiatal hernia   . Hyperlipidemia   . Joint pain   . Left ovarian cyst 04/12/2017  . Night sweats   . Palpitation   . Prediabetes     Past Surgical History:  Procedure Laterality Date  . ABDOMINAL HYSTERECTOMY    . COSMETIC SURGERY      There were no vitals filed for this visit.  Subjective Assessment - 10/11/18 1021    Subjective  I think I'm a little better.   Pain less sharp.  After doing stairs in movie pain increased.  Takes meds in AM and PM only.   Exercises makes her feel better.   Still tight.  SLR stretch give pain from back to knee.  Walked  alot yester day  back was worse  with ache and pressure.      Pain Score  5     Pain Location  Back    Pain Orientation  Right    Pain Descriptors / Indicators  Stabbing    Pain Type  Chronic pain    Pain Onset  More than a month ago    Pain Frequency  Constant    Aggravating Factors   walking /stairs, sit soft chair     Pain Relieving Factors  meds , exercise                        [facility] Adult PT Treatment/Exercise - 10/11/18 0001      Lumbar Exercises: Supine   Ab Set  10 reps    Clam  10 reps    Other Supine Lumbar Exercises  Reviewed HEP and able to do correctly Issued and she did sit to stand  and part sit up and supine single leg clam and prone hip ext and quadraped hip ext/SLR and side clams x 10-15 each.             PT Education - 10/11/18 1125    Education Details  HEP    Person(s) Educated  Patient    Methods  Explanation;Tactile cues;Verbal cues;Handout;Demonstration    Comprehension  Returned demonstration;Verbalized understanding       PT Short Term Goals - 09/29/18 1001      PT SHORT TERM GOAL #1   Title  She will  be independent with initial HEP    Time  2    Period  Weeks    Status  Achieved      PT SHORT TERM GOAL #2   Title  She will report pain in back decr 30% or more with walking    Baseline  Pt states she is 30% better since evaluation but still struggles on inclines and stairs with pain    Time  3    Period  Weeks    Status  Achieved      PT SHORT TERM GOAL #3   Title  She will report awareness of good posture and limiting bending and twisting    Baseline  she is abel to verbal ize this and assume extension when asked10-10-19    Time  3    Period  Weeks    Status  Achieved        PT Long Term Goals - 09/29/18 1002      PT LONG TERM GOAL #1   Title  She will be indendent with all HE{P issued    Time  6    Period  Weeks    Status  On-going      PT LONG TERM GOAL #2   Title  She will report LE pain/burn decr 75% or more    Baseline  Decreased 30% today with medicine 09-29-18    Time  6    Period  Weeks    Status  On-going      PT LONG TERM GOAL #3   Title  She will report return to walking for exercise able to walk 1 mile or more with 2-3 max pain    Baseline  only able to walk 1/4 mile    Time  6    Period  Weeks      PT  LONG TERM GOAL #4   Title  She will be able to sit for 60 min without incr pain with back support.     Baseline  45 to 60 minutes in good chair not lounge chair    Time  6    Period  Weeks    Status  On-going      PT LONG TERM GOAL #5   Title  FOTO score will improve to 40% limited to show improved mobility with less pain    Time  6    Period  Weeks    Status  Unable to assess            Plan - 10/11/18 1026    Clinical Impression Statement   After discussion about exercise and progress it was not conclusive she has had sustained improvement. She feels DN was helpful and is doing HEP but stairs is still very painful and  generally pain is 5-6/10.   Will do 2 more DN sessions and one with me to assess HEP and progress.    Clinical Impairments Affecting Rehab Potential  chronicity , significant degenerative changes.  previous episodes of LBP    PT Treatment/Interventions  Cryotherapy;Electrical Stimulation;Traction;Manual techniques;Passive range of motion;Dry needling;Patient/family education;Therapeutic exercise;Therapeutic activities    PT Next Visit Plan    progress exercises , possible DC in next  visit due to minimal progress.  Pt with Pearson Forster for DC next visit    PT Home Exercise Plan  PPT , LTR , bridge, prone on elbows, tennis ball to gluts,     Consulted and Agree with Plan of Care  Patient  Patient will benefit from skilled therapeutic intervention in order to improve the following deficits and impairments:  Pain, Postural dysfunction, Increased muscle spasms, Decreased range of motion, Decreased activity tolerance, Difficulty walking  Visit Diagnosis: Muscle spasm of back  Chronic bilateral low back pain, unspecified whether sciatica present     Problem List Patient Active Problem List   Diagnosis Date Noted  . Left ovarian cyst 04/12/2017    Darrel Hoover  PT 10/11/2018, 11:37 AM  [facility] [address] [place], [place], [address] Phone: [phone]   Fax:  [phone]  Name: Abigail Guzman MRN: [id number] Date of Birth: 21-Mar-1946

## 2018-10-11 NOTE — Patient Instructions (Signed)
Issued sit to stand modified as needed, side clams, part sit up with 1 leg ext and support to lower back , supine single leg clam. Prone hip ext , quadraped SLR   X 10-30 reps 1-2x/day

## 2018-10-18 ENCOUNTER — Ambulatory Visit: Payer: Medicare Other | Attending: Orthopaedic Surgery | Admitting: Physical Therapy

## 2018-10-18 ENCOUNTER — Encounter: Payer: Self-pay | Admitting: Physical Therapy

## 2018-10-18 DIAGNOSIS — M545 Low back pain: Secondary | ICD-10-CM | POA: Diagnosis not present

## 2018-10-18 DIAGNOSIS — G8929 Other chronic pain: Secondary | ICD-10-CM | POA: Insufficient documentation

## 2018-10-18 DIAGNOSIS — M62830 Muscle spasm of back: Secondary | ICD-10-CM | POA: Insufficient documentation

## 2018-10-18 NOTE — Therapy (Signed)
[place], [place], [address] Phone: [phone]   Fax:  [phone]  Physical Therapy Treatment  Patient Details  Name: Abigail Guzman MRN: [id number] Date of Birth: [birth year] Referring Provider (PT): Jean Rosenthal, MD   Encounter Date: 10/18/2018  PT End of Session - 10/18/18 1421    Visit Number  11    Number of Visits  12    Date for PT Re-Evaluation  10/14/18    Authorization Type  MCR    Authorization Time Period  Kx visit 15       Progress visit 10    PT Start Time  1330    PT Stop Time  1429    PT Time Calculation (min)  59 min    Activity Tolerance  Patient tolerated treatment well;No increased pain    Behavior During Therapy  WFL for tasks assessed/performed       Past Medical History:  Diagnosis Date  . Arthritis   . Bradycardia   . GERD (gastroesophageal reflux disease)   . Hiatal hernia   . Hyperlipidemia   . Joint pain   . Left ovarian cyst 04/12/2017  . Night sweats   . Palpitation   . Prediabetes     Past Surgical History:  Procedure Laterality Date  . ABDOMINAL HYSTERECTOMY    . COSMETIC SURGERY      There were no vitals filed for this visit.  Subjective Assessment - 10/18/18 1336    Subjective  I did someting when I was doing my new exercises and I feel pain on my left hip and it has been hard to go up and down steps.  I was doing better before than and I have trouble walking now. Now my left low back/hip hurts now  Last night I used a heating pad last night    Limitations  Walking    Currently in Pain?  Yes    Pain Score  6     Pain Location  Back    Pain Orientation  Right;Left   left hurts more now   Pain Type  Chronic pain    Pain Onset  More than a month ago    Pain Frequency  Constant                       [facility] Adult PT Treatment/Exercise - 10/18/18 0001      Exercises   Exercises  Lumbar      Lumbar Exercises: Stretches   Quad Stretch  2 reps;30 seconds    Quad Stretch Limitations  prone      Lumbar Exercises: Standing   Other Standing Lumbar Exercises  sit to stand while holding onto PT to simulate holding onto kitchen sink. 10 x then 5 x    pain in knees less.     Lumbar Exercises: Supine   Ab Set  10 reps    AB Set Limitations  10 sec hold    Clam  10 reps   single clam right and left VC and TC for correct exectuion   Clam Limitations  VC for breathing and mx control and with red t band    Bridge  10 reps;3 seconds   pt having cramps in left leg during bridging   Other Supine Lumbar Exercises  partal sit up 10 x with VC and TC      Lumbar Exercises: Prone   Other Prone Lumbar Exercises  prone  hip extension x 10 on right and left needs extra time     Other Prone Lumbar Exercises  q ped SLR x 10 on right and left   pt was twisting trunk and was corrected with demo     Moist Heat Therapy   Number Minutes Moist Heat  14 Minutes    Moist Heat Location  Lumbar Spine      Manual Therapy   Manual therapy comments  skilled palpation with TPDN    Joint Mobilization  PA Grade 3-4 lumbar     Soft tissue mobilization  bil gluteals and piriformis, bil QL    Manual Traction  bil  AP mobs of right hip and IR/ER with piriformis palpation        Trigger Point Dry Needling - 10/18/18 1343    Consent Given?  Yes    Education Handout Provided  No   previously given   Muscles Treated Upper Body  Quadratus Lumborum;Longissimus   bilateral today   Muscles Treated Lower Body  Gluteus minimus;Gluteus maximus;Piriformis   bil   Longissimus Response  Twitch response elicited;Palpable increased muscle length   L-4 to L-5  bil   Gluteus Maximus Response  Twitch response elicited;Palpable increased muscle length    Gluteus Minimus Response  Twitch response elicited;Palpable increased muscle length    Piriformis Response  Twitch response elicited;Palpable increased muscle length             PT Short Term  Goals - 09/29/18 1001      PT SHORT TERM GOAL #1   Title  She will be independent with initial HEP    Time  2    Period  Weeks    Status  Achieved      PT SHORT TERM GOAL #2   Title  She will report pain in back decr 30% or more with walking    Baseline  Pt states she is 30% better since evaluation but still struggles on inclines and stairs with pain    Time  3    Period  Weeks    Status  Achieved      PT SHORT TERM GOAL #3   Title  She will report awareness of good posture and limiting bending and twisting    Baseline  she is abel to verbal ize this and assume extension when asked10-10-19    Time  3    Period  Weeks    Status  Achieved        PT Long Term Goals - 09/29/18 1002      PT LONG TERM GOAL #1   Title  She will be indendent with all HE{P issued    Time  6    Period  Weeks    Status  On-going      PT LONG TERM GOAL #2   Title  She will report LE pain/burn decr 75% or more    Baseline  Decreased 30% today with medicine 09-29-18    Time  6    Period  Weeks    Status  On-going      PT LONG TERM GOAL #3   Title  She will report return to walking for exercise able to walk 1 mile or more with 2-3 max pain    Baseline  only able to walk 1/4 mile    Time  6    Period  Weeks      PT LONG TERM GOAL #4   Title  She will be able to sit for 60 min without incr pain with back support.     Baseline  45 to 60 minutes in good chair not lounge chair    Time  6    Period  Weeks    Status  On-going      PT LONG TERM GOAL #5   Title  FOTO score will improve to 40% limited to show improved mobility with less pain    Time  6    Period  Weeks    Status  Unable to assess            Plan - 10/18/18 1357    Clinical Impression Statement  Pt complained about hurting herself while doing exercises last distributed.  Pt reviewed and carefully explained how to do exericiseswith return demo.  Pt laments that she cannot walk 2 miles like she used to.  Pt will continue for 1  more TPDN. and then DC following visit.  Pt has definite need for strengthening. and has HEP    Clinical Impairments Affecting Rehab Potential  chronicity , significant degenerative changes.  previous episodes of LBP    PT Frequency  2x / week    PT Duration  6 weeks    PT Treatment/Interventions  Cryotherapy;Electrical Stimulation;Traction;Manual techniques;Passive range of motion;Dry needling;Patient/family education;Therapeutic exercise;Therapeutic activities    PT Next Visit Plan    progress exercises , possible DC in next  visit due to minimal progress.  Pt will see Donnetta Simpers for TPDN and then last visit with Pearson Forster for DC    PT Home Exercise Plan  PPT , LTR , bridge, prone on elbows, tennis ball to gluts,     Consulted and Agree with Plan of Care  Patient       Patient will benefit from skilled therapeutic intervention in order to improve the following deficits and impairments:  Pain, Postural dysfunction, Increased muscle spasms, Decreased range of motion, Decreased activity tolerance, Difficulty walking  Visit Diagnosis: Muscle spasm of back  Chronic bilateral low back pain, unspecified whether sciatica present     Problem List Patient Active Problem List   Diagnosis Date Noted  . Left ovarian cyst 04/12/2017    Voncille Lo, PT Certified Exercise Expert for the Aging Adult  10/18/18 2:22 PM Phone: [phone] Fax: [facility] [address] [place], [place], [address] Phone: [phone]   Fax:  [phone]  Name: SUNDEE GARLAND MRN: [id number] Date of Birth: Nov 19, 1946

## 2018-10-25 ENCOUNTER — Ambulatory Visit: Payer: Medicare Other | Admitting: Physical Therapy

## 2018-10-25 ENCOUNTER — Encounter: Payer: Self-pay | Admitting: Physical Therapy

## 2018-10-25 DIAGNOSIS — M545 Low back pain, unspecified: Secondary | ICD-10-CM

## 2018-10-25 DIAGNOSIS — M62830 Muscle spasm of back: Secondary | ICD-10-CM

## 2018-10-25 DIAGNOSIS — G8929 Other chronic pain: Secondary | ICD-10-CM | POA: Diagnosis not present

## 2018-10-25 NOTE — Therapy (Signed)
Abigail Guzman, [place], [address] Phone: [phone]   Fax:  [phone]  Physical Therapy Treatment  Patient Details  Name: Abigail Guzman MRN: [id number] Date of Birth: Sep 22, 1946 Referring Provider (PT): Jean Rosenthal, MD   Encounter Date: 10/25/2018  PT End of Session - 10/25/18 1109    Visit Number  12    Number of Visits  12    Date for PT Re-Evaluation  10/14/18    Authorization Type  MCR    Authorization Time Period  Kx visit 15       Progress visit 10    PT Start Time  1105    PT Stop Time  1200    PT Time Calculation (min)  55 min    Activity Tolerance  Patient tolerated treatment well;No increased pain    Behavior During Therapy  WFL for tasks assessed/performed       Past Medical History:  Diagnosis Date  . Arthritis   . Bradycardia   . GERD (gastroesophageal reflux disease)   . Hiatal hernia   . Hyperlipidemia   . Joint pain   . Left ovarian cyst 04/12/2017  . Night sweats   . Palpitation   . Prediabetes     Past Surgical History:  Procedure Laterality Date  . ABDOMINAL HYSTERECTOMY    . COSMETIC SURGERY      There were no vitals filed for this visit.  Subjective Assessment - 10/25/18 1110    Subjective  I have been doing my exercises.  I have now been able to sit with my feet in front of me (long sitting) for 2-3 hours and then move around watching a movie    Limitations  Walking    How long can you stand comfortably?  AS needed    How long can you walk comfortably?  AS needed with meds   will be 5000 steps a day but not the 2 miles she did before back pain started reiterated    Diagnostic tests  XraysL4-5 Bulge disc press on nerves    Currently in Pain?  Yes    Pain Score  6     Pain Location  Back    Pain Orientation  Right;Left    Pain Type  Chronic pain    Pain Onset  More than a month ago    Pain Frequency  Intermittent                        [facility] Adult PT Treatment/Exercise - 10/25/18 1115      Self-Care   Self-Care  Other Self-Care Comments    Other Self-Care Comments   aquatics and community wellness programs in community      Exercises   Exercises  Lumbar      Lumbar Exercises: Stretches   Single Knee to Chest Stretch  Right;2 reps;30 seconds    Lower Trunk Rotation  2 reps;20 seconds   bil   Pelvic Tilt  10 reps    Quad Stretch  2 reps;30 seconds    Quad Stretch Limitations  prone    Other Lumbar Stretch Exercise  figuire 4 with hip ext fllat on bed with gluteal sets x 10       Lumbar Exercises: Standing   Other Standing Lumbar Exercises  sit to stand with UE extended and not holding on to anything, 9 x,  5 x sts 14.25      Lumbar  Exercises: Supine   Ab Set  10 reps    AB Set Limitations  10 sec hold    Clam  10 reps;3 seconds   with green t band   Bridge  10 reps;3 seconds      Lumbar Exercises: Prone   Other Prone Lumbar Exercises  prone hip extension x 10 on right and left     Other Prone Lumbar Exercises  q ped SLR x 10 on right and left   reinforced not twisting trunk and was corrected with demo     Moist Heat Therapy   Number Minutes Moist Heat  13 Minutes    Moist Heat Location  Lumbar Spine      Manual Therapy   Manual Therapy  Soft tissue mobilization;Joint mobilization    Manual therapy comments  skilled palpation with TPDN    Joint Mobilization  PA Grade 3-4 lumbar     Soft tissue mobilization  bil gluteals and piriformis, bil QL    Manual Traction  bil  AP mobs of right hip and IR/ER with piriformis palpation        Trigger Point Dry Needling - 10/25/18 1122    Consent Given?  Yes    Education Handout Provided  No   previously given   Muscles Treated Upper Body  Quadratus Lumborum;Longissimus   bil   Muscles Treated Lower Body  Gluteus minimus;Gluteus maximus;Piriformis    Longissimus Response  Twitch response elicited;Palpable increased muscle  length   L4/5 and sacrum   Gluteus Maximus Response  Twitch response elicited;Palpable increased muscle length    Gluteus Minimus Response  Twitch response elicited;Palpable increased muscle length    Piriformis Response  Twitch response elicited;Palpable increased muscle length           PT Education - 10/25/18 1143    Education Details  Reviewed HEP, discussed community wellness and aquatics programs    Person(s) Educated  Patient    Methods  Explanation;Demonstration;Tactile cues;Verbal cues    Comprehension  Verbalized understanding;Returned demonstration       PT Short Term Goals - 09/29/18 1001      PT SHORT TERM GOAL #1   Title  She will be independent with initial HEP    Time  2    Period  Weeks    Status  Achieved      PT SHORT TERM GOAL #2   Title  She will report pain in back decr 30% or more with walking    Baseline  Pt states she is 30% better since evaluation but still struggles on inclines and stairs with pain    Time  3    Period  Weeks    Status  Achieved      PT SHORT TERM GOAL #3   Title  She will report awareness of good posture and limiting bending and twisting    Baseline  she is abel to verbal ize this and assume extension when asked10-10-19    Time  3    Period  Weeks    Status  Achieved        PT Long Term Goals - 10/25/18 1112      PT LONG TERM GOAL #1   Title  She will be indendent with all HE{P issued    Baseline  Pt is doing HEP given    Time  6    Period  Weeks    Status  On-going      PT LONG TERM  GOAL #2   Title  She will report LE pain/burn decr 75% or more    Baseline  Pt reports 60% better than eval  It pulsates at night    Time  6    Period  Weeks    Status  On-going      PT LONG TERM GOAL #3   Title  She will report return to walking for exercise able to walk 1 mile or more with 2-3 max pain    Baseline  I get in around 6000 to 8000. ( pt reports she used to do 15000    Time  6    Period  Weeks    Status  On-going       PT LONG TERM GOAL #4   Title  She will be able to sit for 60 min without incr pain with back support.     Baseline  Pt can sit for 60 minutes in a good chair, not lounge chair    Time  6    Period  Weeks    Status  On-going      PT LONG TERM GOAL #5   Title  FOTO score will improve to 40% limited to show improved mobility with less pain    Time  6    Period  Weeks    Status  Unable to assess            Plan - 10/25/18 1145    Clinical Impression Statement  Pt reinforced HEP and pt states she is 6/10 and no real change although she thinks she is able to do her exercises better and reports that she is 605 better than on eval.  Pt was educated on community wellness options includeing aquatics.  Pt has one more appt before DC with Pearson Forster after her retrun trip to [place].     Clinical Impairments Affecting Rehab Potential  chronicity , significant degenerative changes.  previous episodes of LBP    PT Frequency  2x / week    PT Duration  6 weeks    PT Treatment/Interventions  Cryotherapy;Electrical Stimulation;Traction;Manual techniques;Passive range of motion;Dry needling;Patient/family education;Therapeutic exercise;Therapeutic activities    PT Next Visit Plan    progress exercises , DO FOTO   DC in next  visit due to minimal progress.   Pearson Forster for DC    PT Home Exercise Plan  PPT , LTR , bridge, prone on elbows, tennis ball to gluts,     Consulted and Agree with Plan of Care  Patient       Patient will benefit from skilled therapeutic intervention in order to improve the following deficits and impairments:  Pain, Postural dysfunction, Increased muscle spasms, Decreased range of motion, Decreased activity tolerance, Difficulty walking  Visit Diagnosis: Muscle spasm of back  Chronic bilateral low back pain, unspecified whether sciatica present     Problem List Patient Active Problem List   Diagnosis Date Noted  . Left ovarian cyst 04/12/2017   Voncille Lo, PT Certified Exercise Expert for the Aging Adult  10/25/18 11:48 AM Phone: [phone] Fax: [facility] [address] [place], [place], [address] Phone: [phone]   Fax:  [phone]  Name: Abigail Guzman MRN: [id number] Date of Birth: [birth year]

## 2018-11-07 ENCOUNTER — Ambulatory Visit: Payer: Medicare Other

## 2018-11-07 DIAGNOSIS — G8929 Other chronic pain: Secondary | ICD-10-CM

## 2018-11-07 DIAGNOSIS — M62830 Muscle spasm of back: Secondary | ICD-10-CM

## 2018-11-07 DIAGNOSIS — M545 Low back pain: Secondary | ICD-10-CM | POA: Diagnosis not present

## 2018-11-07 NOTE — Therapy (Signed)
[place], [place], [address] Phone: [phone]   Fax:  [phone]  Physical Therapy Treatment/Discharge  Patient Details  Name: Abigail Guzman MRN: [id number] Date of Birth: 05-16-1946 Referring Provider (PT): Jean Rosenthal, MD   Encounter Date: 11/07/2018  PT End of Session - 11/07/18 1021    Visit Number  13    Authorization Type  MCR    PT Start Time  1020    PT Stop Time  1100    PT Time Calculation (min)  40 min    Activity Tolerance  Patient tolerated treatment well;No increased pain    Behavior During Therapy  WFL for tasks assessed/performed       Past Medical History:  Diagnosis Date  . Arthritis   . Bradycardia   . GERD (gastroesophageal reflux disease)   . Hiatal hernia   . Hyperlipidemia   . Joint pain   . Left ovarian cyst 04/12/2017  . Night sweats   . Palpitation   . Prediabetes     Past Surgical History:  Procedure Laterality Date  . ABDOMINAL HYSTERECTOMY    . COSMETIC SURGERY      There were no vitals filed for this visit.  Subjective Assessment - 11/07/18 1021    Subjective  [place]  caused increased pain.   She used meds to control pain.  Stairs walking last week caused inreased pain.  Fell/ collapsed  one day after  walking.  Happened a week ago.   DN helped for first  2 sessions but not after this.      Currently in Pain?  Yes    Pain Score  7     Pain Location  Back    Pain Orientation  Right;Left;Lower    Pain Descriptors / Indicators  Stabbing    Pain Type  Chronic pain    Pain Onset  More than a month ago    Aggravating Factors   walk/ stairs    Pain Relieving Factors  meds         [facility] PT Assessment - 11/07/18 0001      Assessment   Medical Diagnosis  lower back pain    Referring Provider (PT)  Jean Rosenthal, MD      Observation/Other Assessments   Focus on Therapeutic Outcomes (FOTO)   62%  limited      AROM   Lumbar Flexion  70    Lumbar Extension  20    Lumbar - Right Side Bend  10    Lumbar - Left Side Bend  10      Strength   Overall Strength Comments  LE WFL      Transfers   Transfers  Sit to Stand    Sit to Stand  6: Modified independent (Device/Increase time);With armrests;From chair/3-in-1   slow /more deliberate getting out of chair.      Ambulation/Gait   Gait Comments  Walk with normal pace and no deviations .                    [facility] Adult PT Treatment/Exercise - 11/07/18 0001      Self-Care   Other Self-Care Comments   Discussed Next step for return to MD for further assessment of back and new issue for LT shoulder. injury  We discussed progression of HEp and how to monitor if exercises is increaseing pain. She agreed she would continue to do  her HEP and return to MD                PT Short Term Goals - 09/29/18 1001      PT SHORT TERM GOAL #1   Title  She will be independent with initial HEP    Time  2    Period  Weeks    Status  Achieved      PT SHORT TERM GOAL #2   Title  She will report pain in back decr 30% or more with walking    Baseline  Pt states she is 30% better since evaluation but still struggles on inclines and stairs with pain    Time  3    Period  Weeks    Status  Achieved      PT SHORT TERM GOAL #3   Title  She will report awareness of good posture and limiting bending and twisting    Baseline  she is abel to verbal ize this and assume extension when asked10-10-19    Time  3    Period  Weeks    Status  Achieved        PT Long Term Goals - 11/07/18 1100      PT LONG TERM GOAL #1   Title  She will be indendent with all HE{P issued    Baseline  Pt is doing HEP given    Status  Achieved      PT LONG TERM GOAL #2   Title  She will report LE pain/burn decr 75% or more    Status  Not Met      PT LONG TERM GOAL #3   Title  She will report return to walking for exercise able to walk 1 mile or more  with 2-3 max pain    Status  Not Met      PT LONG TERM GOAL #4   Title  She will be able to sit for 60 min without incr pain with back support.     Status  Not Met      PT LONG TERM GOAL #5   Title  FOTO score will improve to 40% limited to show improved mobility with less pain    Status  Not Met            Plan - 11/07/18 1058    Clinical Impression Statement  Ms Bury returns after trip to [place] and during that trip she had incr pain in back from plane ride and walking and stairs whil there. She also reported fall with LT shoulder pain and limitied reach due to pain and incr back pain. Today back pain slightly higher than normal. She is independent with her HEP.  Her pain levels did not decrease over ths course of the PT episode.   She agreed to discharge    PT Treatment/Interventions  Cryotherapy;Electrical Stimulation;Traction;Manual techniques;Passive range of motion;Dry needling;Patient/family education;Therapeutic exercise;Therapeutic activities    PT Next Visit Plan  Discharge with HEP    Consulted and Agree with Plan of Care  Patient       Patient will benefit from skilled therapeutic intervention in order to improve the following deficits and impairments:  Pain, Postural dysfunction, Increased muscle spasms, Decreased range of motion, Decreased activity tolerance, Difficulty walking  Visit Diagnosis: Muscle spasm of back  Chronic bilateral low back pain, unspecified whether sciatica present     Problem List Patient Active Problem List   Diagnosis Date Noted  . Left  ovarian cyst 04/12/2017    Darrel Hoover  Pt 11/07/2018, 11:01 AM  [facility] [address] [place], [place], [address] Phone: [phone]   Fax:  [phone]  Name: Abigail Guzman MRN: [id number] Date of Birth: 1946-04-30  PHYSICAL THERAPY DISCHARGE SUMMARY  Visits from Start of Care: 13  Current functional level related to  goals / functional outcomes: See above   Remaining deficits: See above   Education / Equipment: HEP Plan: Patient agrees to discharge.  Patient goals were partially met. Patient is being discharged due to lack of progress.  ?????    .

## 2018-11-09 ENCOUNTER — Other Ambulatory Visit (INDEPENDENT_AMBULATORY_CARE_PROVIDER_SITE_OTHER): Payer: Self-pay | Admitting: Orthopaedic Surgery

## 2018-11-09 NOTE — Telephone Encounter (Signed)
Please advise 

## 2018-11-14 ENCOUNTER — Other Ambulatory Visit (INDEPENDENT_AMBULATORY_CARE_PROVIDER_SITE_OTHER): Payer: Self-pay

## 2018-11-14 ENCOUNTER — Encounter (INDEPENDENT_AMBULATORY_CARE_PROVIDER_SITE_OTHER): Payer: Self-pay | Admitting: Orthopaedic Surgery

## 2018-11-14 ENCOUNTER — Ambulatory Visit (INDEPENDENT_AMBULATORY_CARE_PROVIDER_SITE_OTHER): Payer: Medicare Other

## 2018-11-14 ENCOUNTER — Ambulatory Visit (INDEPENDENT_AMBULATORY_CARE_PROVIDER_SITE_OTHER): Payer: Medicare Other | Admitting: Orthopaedic Surgery

## 2018-11-14 DIAGNOSIS — M545 Low back pain, unspecified: Secondary | ICD-10-CM

## 2018-11-14 DIAGNOSIS — G8929 Other chronic pain: Secondary | ICD-10-CM

## 2018-11-14 DIAGNOSIS — M79602 Pain in left arm: Secondary | ICD-10-CM

## 2018-11-14 DIAGNOSIS — M5441 Lumbago with sciatica, right side: Principal | ICD-10-CM

## 2018-11-14 DIAGNOSIS — M5442 Lumbago with sciatica, left side: Principal | ICD-10-CM

## 2018-11-14 NOTE — Progress Notes (Signed)
Office Visit Note   Patient: Abigail Guzman           Date of Birth: Sep 11, 1946           MRN: [id number] Visit Date: 11/14/2018              Requested by: Abigail Pillar, MD Abigail Guzman PCP: Abigail Pillar, MD   Assessment & Plan: Visit Diagnoses:  1. Left arm pain   2. Chronic bilateral low back pain without sciatica     Plan: In regards to the left humerus discussed with her that a contusion can take up to 3 months to heal.  She is activities as tolerated with the the left arm.  Due to the fact that her low back pain and radicular symptoms continue despite conservative treatment which included medications ,time, injections and physical therapy recommend referral to neurosurgery.  She is agreeable with this and we will refer to neurosurgery for evaluation of her lumbar radicular pain.  Follow-Up Instructions: Return if symptoms worsen or fail to improve.   Orders:  Orders Placed This Encounter  Procedures  . XR Humerus Left   No orders of the defined types were placed in this encounter.     Procedures: No procedures performed   Clinical Data: No additional findings.   Subjective: Chief Complaint  Patient presents with  . Lower Back - Follow-up    HPI Abigail Guzman returns today follow-up of her low back pain.  She states she still having severe pain.  She states whenever she was in [place] 2 weeks ago she felt like she was going to "die "due to pain in her low back.  She states she ended up falling due to her legs giving way completely.  She landed on her left arm.  No loss consciousness. She states she is now having burning pain down the right leg into the lateral aspect of the lower leg that stops in the ankle.  She has periodic pain in the left leg to the mid calf region.  She feels like she has a constant knot in her low back.  States she is most comfortable when  standing.  She is now having waking pain in the low  back.  She does feel therapy helps on some days and then other days and exacerbates her pain.  She had no bowel or bladder dysfunction. Review of Systems See HPI otherwise negative  Objective: Vital Signs: There were no vitals taken for this visit.  Physical Exam General: Well-developed well-nourished female no acute distress mood and affect appropriate Ortho Exam Tight hamstrings bilaterally good range of motion bilateral hips without pain. Left humerus tender over the midshaft of the humerus.  Good range of motion of the left shoulder with internal and external rotation with minimal discomfort.  5 out of 5 strength with external and internal rotation against resistance. Specialty Comments:  No specialty comments available.  Imaging: Xr Humerus Left  Result Date: 11/14/2018 Left humerus 2 views: No acute fracture.  Humeral heads well located.  No fracture dislocation of the elbow.  No evidence of fracture midshaft.    PMFS History: Patient Active Problem List   Diagnosis Date Noted  . Left ovarian cyst 04/12/2017   Past Medical History:  Diagnosis Date  . Arthritis   . Bradycardia   . GERD (gastroesophageal reflux disease)   . Hiatal hernia   . Hyperlipidemia   . Joint pain   .  Left ovarian cyst 04/12/2017  . Night sweats   . Palpitation   . Prediabetes     Family History  Problem Relation Age of Onset  . Stroke Mother   . Heart disease Father   . Diabetes Father     Past Surgical History:  Procedure Laterality Date  . ABDOMINAL HYSTERECTOMY    . COSMETIC SURGERY     Social History   Occupational History  . Not on file  Tobacco Use  . Smoking status: Former Smoker    Packs/day: 1.00    Years: 10.00    Pack years: 10.00    Types: Cigarettes    Last attempt to quit: 04/13/1979    Years since quitting: 39.6  . Smokeless tobacco: Never Used  Substance and Sexual Activity  . Alcohol use: Yes    Alcohol/week: 2.0 standard drinks    Types: 2 Glasses of wine  per week  . Drug use: Yes    Types: Marijuana  . Sexual activity: Not on file

## 2019-01-05 ENCOUNTER — Other Ambulatory Visit (INDEPENDENT_AMBULATORY_CARE_PROVIDER_SITE_OTHER): Payer: Self-pay | Admitting: Orthopaedic Surgery

## 2019-01-05 NOTE — Telephone Encounter (Signed)
Please advise 

## 2019-01-17 DIAGNOSIS — B353 Tinea pedis: Secondary | ICD-10-CM | POA: Diagnosis not present

## 2019-01-31 DIAGNOSIS — Z1231 Encounter for screening mammogram for malignant neoplasm of breast: Secondary | ICD-10-CM | POA: Diagnosis not present

## 2019-02-16 DIAGNOSIS — M2042 Other hammer toe(s) (acquired), left foot: Secondary | ICD-10-CM | POA: Diagnosis not present

## 2019-02-16 DIAGNOSIS — M205X1 Other deformities of toe(s) (acquired), right foot: Secondary | ICD-10-CM | POA: Diagnosis not present

## 2019-02-16 DIAGNOSIS — M2041 Other hammer toe(s) (acquired), right foot: Secondary | ICD-10-CM | POA: Diagnosis not present

## 2019-02-16 DIAGNOSIS — M205X2 Other deformities of toe(s) (acquired), left foot: Secondary | ICD-10-CM | POA: Diagnosis not present

## 2019-03-01 DIAGNOSIS — R030 Elevated blood-pressure reading, without diagnosis of hypertension: Secondary | ICD-10-CM | POA: Diagnosis not present

## 2019-03-01 DIAGNOSIS — M4316 Spondylolisthesis, lumbar region: Secondary | ICD-10-CM | POA: Diagnosis not present

## 2019-03-01 DIAGNOSIS — Z6832 Body mass index (BMI) 32.0-32.9, adult: Secondary | ICD-10-CM | POA: Diagnosis not present

## 2019-03-01 DIAGNOSIS — M545 Low back pain: Secondary | ICD-10-CM | POA: Diagnosis not present

## 2019-03-01 DIAGNOSIS — M5416 Radiculopathy, lumbar region: Secondary | ICD-10-CM | POA: Diagnosis not present

## 2019-03-01 DIAGNOSIS — M47816 Spondylosis without myelopathy or radiculopathy, lumbar region: Secondary | ICD-10-CM | POA: Diagnosis not present

## 2019-04-27 DIAGNOSIS — M47816 Spondylosis without myelopathy or radiculopathy, lumbar region: Secondary | ICD-10-CM | POA: Diagnosis not present

## 2019-05-16 DIAGNOSIS — M47816 Spondylosis without myelopathy or radiculopathy, lumbar region: Secondary | ICD-10-CM | POA: Diagnosis not present

## 2019-05-23 DIAGNOSIS — Z01419 Encounter for gynecological examination (general) (routine) without abnormal findings: Secondary | ICD-10-CM | POA: Diagnosis not present

## 2019-05-23 DIAGNOSIS — Z6832 Body mass index (BMI) 32.0-32.9, adult: Secondary | ICD-10-CM | POA: Diagnosis not present

## 2019-05-23 DIAGNOSIS — B372 Candidiasis of skin and nail: Secondary | ICD-10-CM | POA: Diagnosis not present

## 2019-05-25 DIAGNOSIS — K219 Gastro-esophageal reflux disease without esophagitis: Secondary | ICD-10-CM | POA: Diagnosis not present

## 2019-05-25 DIAGNOSIS — K580 Irritable bowel syndrome with diarrhea: Secondary | ICD-10-CM | POA: Diagnosis not present

## 2019-05-30 DIAGNOSIS — R1900 Intra-abdominal and pelvic swelling, mass and lump, unspecified site: Secondary | ICD-10-CM | POA: Diagnosis not present

## 2019-06-02 DIAGNOSIS — M47816 Spondylosis without myelopathy or radiculopathy, lumbar region: Secondary | ICD-10-CM | POA: Diagnosis not present

## 2019-07-03 DIAGNOSIS — M47816 Spondylosis without myelopathy or radiculopathy, lumbar region: Secondary | ICD-10-CM | POA: Diagnosis not present

## 2019-07-10 DIAGNOSIS — M47816 Spondylosis without myelopathy or radiculopathy, lumbar region: Secondary | ICD-10-CM | POA: Diagnosis not present

## 2019-08-13 DIAGNOSIS — Z23 Encounter for immunization: Secondary | ICD-10-CM | POA: Diagnosis not present

## 2019-08-16 DIAGNOSIS — K580 Irritable bowel syndrome with diarrhea: Secondary | ICD-10-CM | POA: Diagnosis not present

## 2019-08-16 DIAGNOSIS — K219 Gastro-esophageal reflux disease without esophagitis: Secondary | ICD-10-CM | POA: Diagnosis not present

## 2019-08-22 DIAGNOSIS — M47816 Spondylosis without myelopathy or radiculopathy, lumbar region: Secondary | ICD-10-CM | POA: Diagnosis not present

## 2019-09-29 NOTE — Progress Notes (Signed)
Cardiology Office Note   Date:  10/02/2019   ID:  Abigail Guzman 1946/12/06, MRN [id number]  PCP:  Abigail Guzman, Abigail Guzman  Cardiologist:   Abigail Guzman, Abigail Guzman   No chief complaint on file.     History of Present Illness: Abigail Guzman is a 73 y.o. female seen in f/u for bradycardia. First seen April 2019  Referred by Dr Abigail Guzman Reviewed her office note indicating some symptomatic dizziness. Patient has been on a diet and not clear if symptoms related to this or low HR. No chest pain or dyspnea Occasional palpitations   A1c 6.2 LDL 96 CR 1.3 K 4.2 TSH 3.3   She has more vagal like episodes with nausea. Lightheaded spells monthly don't seem to have Any relationship to pulse or BP.   She has chronic RBBB F/U ETT non ischemic and she reached peak HR 128 bpm TTE with normal EF 60-65% and only mild AR  Activity limited by back pain. Has seen ortho, chiropractor and PT with injections Now on Tylenol 3  Getting back with her ex in [place] and may travel country in RV   Has gained about 30 lbs  Contributes to some dyspnea Needs refill on diuretic for left ankle Edema write for it daily and not qod   Sees Abigail Guzman as primary    Past Medical History:  Diagnosis Date  . Arthritis   . Bradycardia   . GERD (gastroesophageal reflux disease)   . Hiatal hernia   . Hyperlipidemia   . Joint pain   . Left ovarian cyst 04/12/2017  . Night sweats   . Palpitation   . Prediabetes     Past Surgical History:  Procedure Laterality Date  . ABDOMINAL HYSTERECTOMY    . COSMETIC SURGERY       Current Outpatient Medications  Medication Sig Dispense Refill  . acetaminophen-codeine (TYLENOL #3) 300-30 MG tablet TAKE 1 TO 2 TABLETS BY MOUTH EVERY 8 HOURS AS NEEDED FOR MODERATE PAIN 40 tablet 0  . dicyclomine (BENTYL) 10 MG capsule Take 10 mg by mouth 2 (two) times daily at 10 AM and 5 PM. PATIENT CAN TAKE AN EXTRA CAPSULES BY MOUTH AS NEEDED    . etodolac (LODINE) 400 MG tablet  Take 400 mg by mouth 2 (two) times daily.    . hydrocortisone (ANUSOL-HC) 2.5 % rectal cream Place 1 application rectally 2 (two) times daily.    . hyoscyamine (LEVSIN) 0.125 MG/5ML ELIX Take 0.125 mg by mouth as directed.    . NON FORMULARY MULTI FOR HER PACKET , TAKE 1 PACKET BY MOUTH DAILY    . omeprazole (PRILOSEC) 40 MG capsule Take 40 mg by mouth daily.    . Probiotic Product (PROBIOTIC PO) Take by mouth as directed.    . simvastatin (ZOCOR) 40 MG tablet Take 40 mg by mouth daily.    Marland Kitchen Specialty Vitamins Products (COLLAGEN ULTRA PO) Take by mouth. TAKE AS DIRECTED    . triamterene-hydrochlorothiazide (DYAZIDE) 37.5-25 MG capsule Take 1 each (1 capsule total) by mouth daily. 90 capsule 3  . TURMERIC PO Take by mouth as directed.    Marland Kitchen VITAMIN D, CHOLECALCIFEROL, PO Take 1 tablet by mouth daily.     Current Facility-Administered Medications  Medication Dose Route Frequency Provider Last Rate Last Dose  . cetirizine (ZYRTEC) tablet 10 mg  10 mg Oral Once Abigail Coop, PA-C        Allergies:   Sulfa antibiotics  Social History:  The patient  reports that she quit smoking about 40 years ago. Her smoking use included cigarettes. She has a 10.00 pack-year smoking history. She has never used smokeless tobacco. She reports current alcohol use of about 2.0 standard drinks of alcohol per week. She reports current drug use. Drug: Marijuana.   Family History:  The patient's family history includes Diabetes in her father; Heart disease in her father; Stroke in her mother.    ROS:  Please see the history of present illness.   Otherwise, review of systems are positive for none.   All other systems are reviewed and negative.    PHYSICAL EXAM: VS:  BP 112/70   Pulse 63   Ht 5\' 5"  (1.651 m)   Wt 212 lb (96.2 kg)   SpO2 97%   BMI 35.28 kg/m  , BMI Body mass index is 35.28 kg/m. Affect appropriate Healthy:  appears stated age 58: normal Neck supple with no adenopathy JVP normal no  bruits no thyromegaly Lungs clear with no wheezing and good diaphragmatic motion Heart:  S1/S2 mild AR  murmur, no rub, gallop or click PMI normal Abdomen: benighn, BS positve, no tenderness, no AAA no bruit.  No HSM or HJR Distal pulses intact with no bruits No edema Neuro non-focal Skin warm and dry No muscular weakness     EKG:  02/09/18 SR RBBB PR 150 msec PVC;s 03/17/18 SR RBBB low voltage rate 57  10/02/19 SR rate 63 LAD RBBB no changes    Recent Labs: No results found for requested labs within last 8760 hours.    Lipid Panel No results found for: CHOL, TRIG, HDL, CHOLHDL, VLDL, LDLCALC, LDLDIRECT    Wt Readings from Last 3 Encounters:  10/02/19 212 lb (96.2 kg)  09/07/18 214 lb 4 oz (97.2 kg)  03/17/18 200 lb 12 oz (91.1 kg)      Other studies Reviewed: Additional studies/ records that were reviewed today include: Notes Dr Abigail Guzman ECG and labs .ETT 03/29/18 And TTE 03/29/18    ASSESSMENT AND PLAN:  1. Bradycardia seems benign and not related to any symptoms RBBB also benign Home readings Show pulses routinely in mid 50's  Peak HR during ETT 03/29/18 acceptable at 125 bpm   2. HTN:  Well controlled.  Continue current medications and low sodium Dash type diet.    3. HLD on statin labs with primary   4. Pre DM:  Discussed low carb diet   5. AR:  Mild by echo 03/29/18 with normal LV size and function EF 60-65% observe    Current medicines are reviewed at length with the patient today.  The patient does not have concerns regarding medicines.  The following changes have been made:  None   Labs/ tests ordered today include:    Orders Placed This Encounter  Procedures  . EKG 12-Lead     Disposition:   FU with me in a year     Signed, Abigail Guzman, Abigail Guzman  10/02/2019 9:34 AM    [facility] [address], [place], [place]  [address] Phone: [phone]; Fax: [phone]

## 2019-10-02 ENCOUNTER — Other Ambulatory Visit: Payer: Self-pay

## 2019-10-02 ENCOUNTER — Ambulatory Visit (INDEPENDENT_AMBULATORY_CARE_PROVIDER_SITE_OTHER): Payer: Medicare Other | Admitting: Cardiovascular Disease

## 2019-10-02 ENCOUNTER — Encounter: Payer: Self-pay | Admitting: Cardiovascular Disease

## 2019-10-02 VITALS — BP 112/70 | HR 63 | Ht 65.0 in | Wt 212.0 lb

## 2019-10-02 DIAGNOSIS — R609 Edema, unspecified: Secondary | ICD-10-CM

## 2019-10-02 DIAGNOSIS — I4510 Unspecified right bundle-branch block: Secondary | ICD-10-CM | POA: Diagnosis not present

## 2019-10-02 DIAGNOSIS — R001 Bradycardia, unspecified: Secondary | ICD-10-CM

## 2019-10-02 MED ORDER — TRIAMTERENE-HCTZ 37.5-25 MG PO CAPS: 1.0000 | ORAL_CAPSULE | Freq: Every day | ORAL | 3 refills | Status: DC

## 2019-10-02 NOTE — Patient Instructions (Signed)
Medication Instructions:  *If you need a refill on your cardiac medications before your next appointment, please call your pharmacy*  Lab Work: If you have labs (blood work) drawn today and your tests are completely normal, you will receive your results only by: . MyChart Message (if you have MyChart) OR . A paper copy in the mail If you have any lab test that is abnormal or we need to change your treatment, we will call you to review the results.  Testing/Procedures: None ordered today.  Follow-Up: At [facility], you and your health needs are our priority.  As part of our continuing mission to provide you with exceptional heart care, we have created designated Provider Care Teams.  These Care Teams include your primary Cardiologist (physician) and Advanced Practice Providers (APPs -  Physician Assistants and Nurse Practitioners) who all work together to provide you with the care you need, when you need it.  Your next appointment:   12 month(s)  The format for your next appointment:   In Person  Provider:   You may see Dr. Nishan or one of the following Advanced Practice Providers on your designated Care Team:    Lori Gerhardt, NP  Laura Ingold, NP  Jill McDaniel, NP   

## 2019-10-23 DIAGNOSIS — N1831 Chronic kidney disease, stage 3a: Secondary | ICD-10-CM | POA: Diagnosis not present

## 2019-10-23 DIAGNOSIS — K589 Irritable bowel syndrome without diarrhea: Secondary | ICD-10-CM | POA: Diagnosis not present

## 2019-10-23 DIAGNOSIS — E7800 Pure hypercholesterolemia, unspecified: Secondary | ICD-10-CM | POA: Diagnosis not present

## 2019-10-23 DIAGNOSIS — I4510 Unspecified right bundle-branch block: Secondary | ICD-10-CM | POA: Diagnosis not present

## 2019-10-23 DIAGNOSIS — Z0000 Encounter for general adult medical examination without abnormal findings: Secondary | ICD-10-CM | POA: Diagnosis not present

## 2019-10-23 DIAGNOSIS — K219 Gastro-esophageal reflux disease without esophagitis: Secondary | ICD-10-CM | POA: Diagnosis not present

## 2019-10-23 DIAGNOSIS — I129 Hypertensive chronic kidney disease with stage 1 through stage 4 chronic kidney disease, or unspecified chronic kidney disease: Secondary | ICD-10-CM | POA: Diagnosis not present

## 2019-10-23 DIAGNOSIS — R7303 Prediabetes: Secondary | ICD-10-CM | POA: Diagnosis not present

## 2019-11-06 DIAGNOSIS — L821 Other seborrheic keratosis: Secondary | ICD-10-CM | POA: Diagnosis not present

## 2019-11-06 DIAGNOSIS — D2361 Other benign neoplasm of skin of right upper limb, including shoulder: Secondary | ICD-10-CM | POA: Diagnosis not present

## 2019-11-06 DIAGNOSIS — D485 Neoplasm of uncertain behavior of skin: Secondary | ICD-10-CM | POA: Diagnosis not present

## 2019-11-06 DIAGNOSIS — L814 Other melanin hyperpigmentation: Secondary | ICD-10-CM | POA: Diagnosis not present

## 2019-11-06 DIAGNOSIS — L570 Actinic keratosis: Secondary | ICD-10-CM | POA: Diagnosis not present

## 2019-11-06 DIAGNOSIS — L905 Scar conditions and fibrosis of skin: Secondary | ICD-10-CM | POA: Diagnosis not present

## 2019-11-15 DIAGNOSIS — S51811A Laceration without foreign body of right forearm, initial encounter: Secondary | ICD-10-CM | POA: Diagnosis not present

## 2020-01-09 ENCOUNTER — Ambulatory Visit: Payer: Medicare Other

## 2020-01-15 DIAGNOSIS — M47816 Spondylosis without myelopathy or radiculopathy, lumbar region: Secondary | ICD-10-CM | POA: Diagnosis not present

## 2020-01-18 ENCOUNTER — Ambulatory Visit: Payer: Medicare Other | Attending: Internal Medicine

## 2020-01-18 DIAGNOSIS — Z23 Encounter for immunization: Secondary | ICD-10-CM | POA: Insufficient documentation

## 2020-01-18 NOTE — Progress Notes (Signed)
   Covid-19 Vaccination Clinic  Name:  Abigail Guzman    MRN: [id number] DOB: September 04, 1946  01/18/2020  [name] was observed post Covid-19 immunization for 15 minutes without incidence. She was provided with Vaccine Information Sheet and instruction to access the V-Safe system.   [name] was instructed to call 911 with any severe reactions post vaccine: Marland Kitchen Difficulty breathing  . Swelling of your face and throat  . A fast heartbeat  . A bad rash all over your body  . Dizziness and weakness    Immunizations Administered    Name Date Dose VIS Date Route   Pfizer COVID-19 Vaccine 01/18/2020  5:52 PM 0.3 mL 11/24/2019 Intramuscular   Manufacturer: [place]   Lot: [id number]   [place]: [id number]

## 2020-01-26 ENCOUNTER — Ambulatory Visit: Payer: Medicare Other

## 2020-01-29 DIAGNOSIS — M47816 Spondylosis without myelopathy or radiculopathy, lumbar region: Secondary | ICD-10-CM | POA: Diagnosis not present

## 2020-02-06 DIAGNOSIS — Z1231 Encounter for screening mammogram for malignant neoplasm of breast: Secondary | ICD-10-CM | POA: Diagnosis not present

## 2020-02-13 ENCOUNTER — Ambulatory Visit: Payer: Medicare Other | Attending: Internal Medicine

## 2020-02-13 DIAGNOSIS — Z23 Encounter for immunization: Secondary | ICD-10-CM | POA: Insufficient documentation

## 2020-02-13 NOTE — Progress Notes (Signed)
   Covid-19 Vaccination Clinic  Name:  Abigail Guzman    MRN: [id number] DOB: 1946-09-03  02/13/2020  [name] was observed post Covid-19 immunization for 15 minutes without incident. She was provided with Vaccine Information Sheet and instruction to access the V-Safe system.   [name] was instructed to call 911 with any severe reactions post vaccine: Marland Kitchen Difficulty breathing  . Swelling of face and throat  . A fast heartbeat  . A bad rash all over body  . Dizziness and weakness   Immunizations Administered    Name Date Dose VIS Date Route   Pfizer COVID-19 Vaccine 02/13/2020 10:15 AM 0.3 mL 11/24/2019 Intramuscular   Manufacturer: [place]   Lot: [id number]   [place]: [id number]

## 2020-03-01 DIAGNOSIS — Z13820 Encounter for screening for osteoporosis: Secondary | ICD-10-CM | POA: Diagnosis not present

## 2020-03-06 DIAGNOSIS — M5416 Radiculopathy, lumbar region: Secondary | ICD-10-CM | POA: Diagnosis not present

## 2020-03-06 DIAGNOSIS — M47816 Spondylosis without myelopathy or radiculopathy, lumbar region: Secondary | ICD-10-CM | POA: Diagnosis not present

## 2020-03-06 DIAGNOSIS — Z6833 Body mass index (BMI) 33.0-33.9, adult: Secondary | ICD-10-CM | POA: Diagnosis not present

## 2020-03-06 DIAGNOSIS — R030 Elevated blood-pressure reading, without diagnosis of hypertension: Secondary | ICD-10-CM | POA: Diagnosis not present

## 2020-04-22 DIAGNOSIS — R7303 Prediabetes: Secondary | ICD-10-CM | POA: Diagnosis not present

## 2020-04-22 DIAGNOSIS — M549 Dorsalgia, unspecified: Secondary | ICD-10-CM | POA: Diagnosis not present

## 2020-04-22 DIAGNOSIS — I129 Hypertensive chronic kidney disease with stage 1 through stage 4 chronic kidney disease, or unspecified chronic kidney disease: Secondary | ICD-10-CM | POA: Diagnosis not present

## 2020-04-22 DIAGNOSIS — N1831 Chronic kidney disease, stage 3a: Secondary | ICD-10-CM | POA: Diagnosis not present

## 2020-04-22 DIAGNOSIS — E7800 Pure hypercholesterolemia, unspecified: Secondary | ICD-10-CM | POA: Diagnosis not present

## 2020-07-10 ENCOUNTER — Other Ambulatory Visit: Payer: Self-pay | Admitting: Cardiovascular Disease

## 2020-07-15 DIAGNOSIS — M47816 Spondylosis without myelopathy or radiculopathy, lumbar region: Secondary | ICD-10-CM | POA: Diagnosis not present

## 2020-07-29 DIAGNOSIS — M47816 Spondylosis without myelopathy or radiculopathy, lumbar region: Secondary | ICD-10-CM | POA: Diagnosis not present

## 2020-08-16 DIAGNOSIS — Z23 Encounter for immunization: Secondary | ICD-10-CM | POA: Diagnosis not present

## 2020-08-26 DIAGNOSIS — M47816 Spondylosis without myelopathy or radiculopathy, lumbar region: Secondary | ICD-10-CM | POA: Diagnosis not present

## 2020-08-26 DIAGNOSIS — M4316 Spondylolisthesis, lumbar region: Secondary | ICD-10-CM | POA: Diagnosis not present

## 2020-08-26 DIAGNOSIS — M48061 Spinal stenosis, lumbar region without neurogenic claudication: Secondary | ICD-10-CM | POA: Diagnosis not present

## 2020-08-27 ENCOUNTER — Other Ambulatory Visit: Payer: Self-pay | Admitting: Neurosurgery

## 2020-08-27 DIAGNOSIS — M48061 Spinal stenosis, lumbar region without neurogenic claudication: Secondary | ICD-10-CM

## 2020-09-09 DIAGNOSIS — Z01419 Encounter for gynecological examination (general) (routine) without abnormal findings: Secondary | ICD-10-CM | POA: Diagnosis not present

## 2020-09-09 DIAGNOSIS — Z23 Encounter for immunization: Secondary | ICD-10-CM | POA: Diagnosis not present

## 2020-09-09 DIAGNOSIS — Z6834 Body mass index (BMI) 34.0-34.9, adult: Secondary | ICD-10-CM | POA: Diagnosis not present

## 2020-09-15 ENCOUNTER — Other Ambulatory Visit: Payer: Self-pay

## 2020-09-15 ENCOUNTER — Ambulatory Visit
Admission: RE | Admit: 2020-09-15 | Discharge: 2020-09-15 | Disposition: A | Discharge status: Home / Self Care | Payer: Medicare Other | Source: Ambulatory Visit | Attending: Neurosurgery | Admitting: Neurosurgery

## 2020-09-15 DIAGNOSIS — M48061 Spinal stenosis, lumbar region without neurogenic claudication: Secondary | ICD-10-CM

## 2020-09-19 ENCOUNTER — Other Ambulatory Visit: Payer: Self-pay | Admitting: Cardiovascular Disease

## 2020-09-23 NOTE — Progress Notes (Signed)
Cardiology Office Note   Date:  10/03/2020   ID:  Abigail, Guzman 03/06/1946, MRN [id number]  PCP:  Kelton Pillar, MD  Cardiologist:   Jenkins Rouge, MD   No chief complaint on file.     History of Present Illness: Abigail Guzman is a 74 y.o. female seen in f/u for bradycardia. First seen April 2019  Referred by Dr Laurann Montana Reviewed her office note indicating some symptomatic dizziness. Patient has been on a diet and not clear if symptoms related to this or low HR. No chest pain or dyspnea Occasional palpitations   She has chronic RBBB F/U ETT non ischemic and she reached peak HR 128 bpm  TTE with normal EF 60-65% and only mild AR  Activity limited by back pain. Has seen ortho, chiropractor and PT with injections Now on Tylenol 3  Her ex unfortunately had a stroke in [place] and she has not been able to travel due to [place] as primary   Received COVID vaccine March   She had a vagal ? Episode this week Had been gardening for 4 days and felt dizzy jumping up to answer Door bell no palpitations    Past Medical History:  Diagnosis Date  . Arthritis   . Bradycardia   . GERD (gastroesophageal reflux disease)   . Hiatal hernia   . Hyperlipidemia   . Joint pain   . Left ovarian cyst 04/12/2017  . Night sweats   . Palpitation   . Prediabetes     Past Surgical History:  Procedure Laterality Date  . ABDOMINAL HYSTERECTOMY    . COSMETIC SURGERY       Current Outpatient Medications  Medication Sig Dispense Refill  . hydrocortisone (ANUSOL-HC) 2.5 % rectal cream Place 1 application rectally 2 (two) times daily.    . hyoscyamine (LEVSIN) 0.125 MG/5ML ELIX Take 0.125 mg by mouth as directed.    . meloxicam (MOBIC) 15 MG tablet meloxicam 15 mg tablet   1 tablet as needed by oral route.    Marland Kitchen omeprazole (PRILOSEC) 40 MG capsule Take 40 mg by mouth daily.    . Probiotic Product (PROBIOTIC PO) Take by mouth as directed.    . simvastatin  (ZOCOR) 40 MG tablet Take 40 mg by mouth daily.    Marland Kitchen Specialty Vitamins Products (COLLAGEN ULTRA PO) Take by mouth. TAKE AS DIRECTED    . triamterene-hydrochlorothiazide (DYAZIDE) 37.5-25 MG capsule TAKE 1 CAPSULE EVERY DAY (NEED YEARLY APPT WITH DR TURNER IN OCTOBER FOR REFILLS. CALL [phone]) 90 capsule 0  . TURMERIC PO Take by mouth as directed.    Marland Kitchen VITAMIN D, CHOLECALCIFEROL, PO Take 1 tablet by mouth daily.     Current Facility-Administered Medications  Medication Dose Route Frequency Provider Last Rate Last Admin  . cetirizine (ZYRTEC) tablet 10 mg  10 mg Oral Once Tereasa Coop, PA-C        Allergies:   Sulfa antibiotics    Social History:  The patient  reports that she quit smoking about 41 years ago. Her smoking use included cigarettes. She has a 10.00 pack-year smoking history. She has never used smokeless tobacco. She reports current alcohol use of about 2.0 standard drinks of alcohol per week. She reports current drug use. Drug: Marijuana.   Family History:  The patient's family history includes Diabetes in her father; Heart disease in her father; Stroke in her mother.    ROS:  Please see the history of present  illness.   Otherwise, review of systems are positive for none.   All other systems are reviewed and negative.    PHYSICAL EXAM: VS:  BP 128/80   Pulse 82   Ht 5\' 6"  (1.676 m)   Wt 210 lb 12.8 oz (95.6 kg)   SpO2 97%   BMI 34.02 kg/m  , BMI Body mass index is 34.02 kg/m. Affect appropriate Healthy:  appears stated age 34: normal Neck supple with no adenopathy JVP normal no bruits no thyromegaly Lungs clear with no wheezing and good diaphragmatic motion Heart:  S1/S2 mild AR  murmur, no rub, gallop or click PMI normal Abdomen: benighn, BS positve, no tenderness, no AAA no bruit.  No HSM or HJR Distal pulses intact with no bruits No edema Neuro non-focal Skin warm and dry No muscular weakness     EKG:  02/09/18 SR RBBB PR 150 msec PVC;s  03/17/18 SR RBBB low voltage rate 57  10/02/19 SR rate 63 LAD RBBB no changes    Recent Labs: No results found for requested labs within last 8760 hours.    Lipid Panel No results found for: CHOL, TRIG, HDL, CHOLHDL, VLDL, LDLCALC, LDLDIRECT    Wt Readings from Last 3 Encounters:  10/03/20 210 lb 12.8 oz (95.6 kg)  10/02/19 212 lb (96.2 kg)  09/07/18 214 lb 4 oz (97.2 kg)      Other studies Reviewed: Additional studies/ records that were reviewed today include: Notes Dr Laurann Montana ECG and labs .ETT 03/29/18 And TTE 03/29/18    ASSESSMENT AND PLAN:  1. Bradycardia seems benign and not related to any symptoms RBBB also benign  Peak HR during ETT 03/29/18 acceptable at 125 bpm and HR today in 80's   2. HTN:  Well controlled.  Continue current medications and low sodium Dash type diet.    3. HLD on statin labs with primary   4. Pre DM:  Discussed low carb diet   5. AR:  Mild by echo 03/29/18 with normal LV size and function EF 60-65% observe    Current medicines are reviewed at length with the patient today.  The patient does not have concerns regarding medicines.  The following changes have been made:  None   Labs/ tests ordered today include:    No orders of the defined types were placed in this encounter.    Disposition:   FU with me in a year     Signed, Jenkins Rouge, MD  10/03/2020 9:19 AM    [facility] [address], [place], [place]  [address] Phone: [phone]; Fax: [phone]

## 2020-09-25 DIAGNOSIS — M48061 Spinal stenosis, lumbar region without neurogenic claudication: Secondary | ICD-10-CM | POA: Diagnosis not present

## 2020-09-25 DIAGNOSIS — M5416 Radiculopathy, lumbar region: Secondary | ICD-10-CM | POA: Diagnosis not present

## 2020-09-30 ENCOUNTER — Ambulatory Visit (HOSPITAL_COMMUNITY)
Admission: EM | Admit: 2020-09-30 | Discharge: 2020-09-30 | Disposition: A | Discharge status: Home / Self Care | Payer: Medicare Other

## 2020-09-30 ENCOUNTER — Encounter (HOSPITAL_COMMUNITY): Payer: Self-pay

## 2020-09-30 ENCOUNTER — Ambulatory Visit (INDEPENDENT_AMBULATORY_CARE_PROVIDER_SITE_OTHER): Payer: Medicare Other

## 2020-09-30 ENCOUNTER — Other Ambulatory Visit: Payer: Self-pay

## 2020-09-30 DIAGNOSIS — R42 Dizziness and giddiness: Secondary | ICD-10-CM

## 2020-09-30 DIAGNOSIS — S5011XA Contusion of right forearm, initial encounter: Secondary | ICD-10-CM

## 2020-09-30 DIAGNOSIS — W19XXXA Unspecified fall, initial encounter: Secondary | ICD-10-CM

## 2020-09-30 DIAGNOSIS — M25511 Pain in right shoulder: Secondary | ICD-10-CM

## 2020-09-30 DIAGNOSIS — M19011 Primary osteoarthritis, right shoulder: Secondary | ICD-10-CM | POA: Diagnosis not present

## 2020-09-30 DIAGNOSIS — S301XXA Contusion of abdominal wall, initial encounter: Secondary | ICD-10-CM

## 2020-09-30 DIAGNOSIS — T148XXA Other injury of unspecified body region, initial encounter: Secondary | ICD-10-CM

## 2020-09-30 NOTE — ED Provider Notes (Signed)
[place]   MRN: [id number] DOB: [birth year]  Subjective:   Abigail Guzman is a 74 y.o. female presenting for 4-day history of intermittent and progressive dizziness, lightheadedness and feeling shaky with mild nausea and intermittent frontal headache.  Patient states that symptoms started after she had an accidental fall while gardening.  She fell directly onto a metal rod making mostly impact with her right side including her forearm and right flank side.  Denies head trauma, loss of consciousness.  Denies weakness, shortness of breath, confusion.  Denies any chest pain, numbness or tingling, vision change.   Current Facility-Administered Medications:  .  cetirizine (ZYRTEC) tablet 10 mg, 10 mg, Oral, Once, Tereasa Coop, PA-C  Current Outpatient Medications:  .  dicyclomine (BENTYL) 10 MG capsule, Take 10 mg by mouth 2 (two) times daily at 10 AM and 5 PM. PATIENT CAN TAKE AN EXTRA CAPSULES BY MOUTH AS NEEDED, Disp: , Rfl:  .  meloxicam (MOBIC) 15 MG tablet, meloxicam 15 mg tablet   1 tablet as needed by oral route., Disp: , Rfl:  .  omeprazole (PRILOSEC) 40 MG capsule, Take 40 mg by mouth daily., Disp: , Rfl:  .  simvastatin (ZOCOR) 40 MG tablet, Take 40 mg by mouth daily., Disp: , Rfl:  .  triamterene-hydrochlorothiazide (DYAZIDE) 37.5-25 MG capsule, TAKE 1 CAPSULE EVERY DAY (NEED YEARLY APPT WITH DR TURNER IN OCTOBER FOR REFILLS. CALL [phone]), Disp: 90 capsule, Rfl: 0 .  TURMERIC PO, Take by mouth as directed., Disp: , Rfl:  .  VITAMIN D, CHOLECALCIFEROL, PO, Take 1 tablet by mouth daily., Disp: , Rfl:  .  acetaminophen-codeine (TYLENOL #3) 300-30 MG tablet, TAKE 1 TO 2 TABLETS BY MOUTH EVERY 8 HOURS AS NEEDED FOR MODERATE PAIN, Disp: 40 tablet, Rfl: 0 .  etodolac (LODINE) 400 MG tablet, Take 400 mg by mouth 2 (two) times daily., Disp: , Rfl:  .  hydrocortisone (ANUSOL-HC) 2.5 % rectal cream, Place 1 application rectally 2 (two) times daily., Disp: , Rfl:   .  hyoscyamine (LEVSIN) 0.125 MG/5ML ELIX, Take 0.125 mg by mouth as directed., Disp: , Rfl:  .  NON FORMULARY, MULTI FOR HER PACKET , TAKE 1 PACKET BY MOUTH DAILY, Disp: , Rfl:  .  Probiotic Product (PROBIOTIC PO), Take by mouth as directed., Disp: , Rfl:  .  Specialty Vitamins Products (COLLAGEN ULTRA PO), Take by mouth. TAKE AS DIRECTED, Disp: , Rfl:    Allergies  Allergen Reactions  . Sulfa Antibiotics Rash    Past Medical History:  Diagnosis Date  . Arthritis   . Bradycardia   . GERD (gastroesophageal reflux disease)   . Hiatal hernia   . Hyperlipidemia   . Joint pain   . Left ovarian cyst 04/12/2017  . Night sweats   . Palpitation   . Prediabetes      Past Surgical History:  Procedure Laterality Date  . ABDOMINAL HYSTERECTOMY    . COSMETIC SURGERY      Family History  Problem Relation Age of Onset  . Stroke Mother   . Heart disease Father   . Diabetes Father     Social History   Tobacco Use  . Smoking status: Former Smoker    Packs/day: 1.00    Years: 10.00    Pack years: 10.00    Types: Cigarettes    Quit date: 04/13/1979    Years since quitting: 41.4  . Smokeless tobacco: Never Used  Vaping Use  . Vaping  Use: Never used  Substance Use Topics  . Alcohol use: Yes    Alcohol/week: 2.0 standard drinks    Types: 2 Glasses of wine per week  . Drug use: Yes    Types: Marijuana    ROS   Objective:   Vitals: BP (!) 156/81 (BP Location: Left Wrist)   Pulse 77   Temp 97.6 F (36.4 C) (Oral)   Resp 18   SpO2 100%   Physical Exam Constitutional:      General: She is not in acute distress.    Appearance: Normal appearance. She is well-developed. She is not ill-appearing, toxic-appearing or diaphoretic.  HENT:     Head: Normocephalic and atraumatic.     Right Ear: External ear normal.     Left Ear: External ear normal.     Nose: Nose normal.     Mouth/Throat:     Mouth: Mucous membranes are moist.  Eyes:     General: No scleral icterus.        Right eye: No discharge.        Left eye: No discharge.     Extraocular Movements: Extraocular movements intact.     Conjunctiva/sclera: Conjunctivae normal.     Pupils: Pupils are equal, round, and reactive to light.  Cardiovascular:     Rate and Rhythm: Normal rate and regular rhythm.     Pulses: Normal pulses.     Heart sounds: Normal heart sounds. No murmur heard.  No friction rub. No gallop.   Pulmonary:     Effort: Pulmonary effort is normal. No respiratory distress.     Breath sounds: Normal breath sounds. No stridor. No wheezing, rhonchi or rales.  Skin:    General: Skin is warm and dry.     Findings: Bruising (contusion of ~3cm along lower right flank side, right lateral/dorsal forearm, abrasion along right lower lateral neck) present. No rash.  Neurological:     Mental Status: She is alert and oriented to person, place, and time.     Cranial Nerves: No cranial nerve deficit.     Motor: No weakness.     Coordination: Coordination normal.     Gait: Gait normal.     Deep Tendon Reflexes: Reflexes normal.     Comments: Negative Romberg and pronator drift.  Psychiatric:        Mood and Affect: Mood normal.        Behavior: Behavior normal.        Thought Content: Thought content normal.        Judgment: Judgment normal.     ED ECG REPORT   Date: 09/30/2020  Rate: 72bpm  Rhythm: normal sinus rhythm  QRS Axis: normal  Intervals: normal  ST/T Wave abnormalities: nonspecific T wave changes  Conduction Disutrbances:right bundle branch block  Narrative Interpretation: Sinus rhythm at 72 bpm with right bundle branch block, nonspecific T wave flattening in lead aVF and T wave inversion in V3.  Largely comparable to previous EKG.  Old EKG Reviewed: unchanged  I have personally reviewed the EKG tracing and agree with the computerized printout as noted.  DG Clavicle Right  Result Date: 09/30/2020 CLINICAL DATA:  Fall.  Right clavicle pain EXAM: RIGHT CLAVICLE - 2+ VIEWS  COMPARISON:  None. FINDINGS: Negative for fracture. Normal shoulder. AC degenerative change with spurring. IMPRESSION: Negative for fracture. Electronically Signed   By: Franchot Gallo M.D.   On: 09/30/2020 19:31     Assessment and Plan :   PDMP not  reviewed this encounter.  1. Dizziness   2. Accidental fall, initial encounter   3. Contusion of right clavicle, initial encounter   4. Contusion, flank, initial encounter   5. Contusion of right forearm, initial encounter     Patient has a normal neurologic exam, reassuring physical exam findings, chest x-ray, EKG.  Counseled patient that if she would like to make sure she did not suffer an intracranial injury then a CT scan can reassure her but would have to go through the emergency room.  At this time I do not have any physical exam findings or medical data to support this.  Recommend supportive care.  Patient was agreeable to this, will have close follow-up with her cardiologist and PCP this week. Counseled patient on potential for adverse effects with medications prescribed/recommended today, ER and return-to-clinic precautions discussed, patient verbalized understanding.    Jaynee Eagles, [place] 10/01/20 [id number]

## 2020-09-30 NOTE — ED Triage Notes (Signed)
Pt states that on Friday she lost her balance while gardening and struck her head and right side of her neck into a metal shepherd's hook and fell on her right side. Pt states she had to crawl and push herself up using a large concrete planter.  C/o bruising to right flankPt states she was able to perform normal ADL's over the weekend.  States she stood from sitting position and walked to answer her front door this morning, and she became dizziness and lightheaded, and her neighbor had to ease the pt to seated position. C/o pain to head.   Pt states she felt "shakey, and head feels foggy", with mild nausea.  Reports she ate eggs, green juice this morning and since dizziness episode has had juice and peanut butter.   Denies LOC, CP, SOB, extremity numbness.  Notified Dr. Meda Coffee of pt status and c/c. Dr. Meda Coffee advised that we can evaluate pt here according to time of arrival.

## 2020-10-03 ENCOUNTER — Other Ambulatory Visit: Payer: Self-pay

## 2020-10-03 ENCOUNTER — Ambulatory Visit (INDEPENDENT_AMBULATORY_CARE_PROVIDER_SITE_OTHER): Payer: Medicare Other | Admitting: Cardiovascular Disease

## 2020-10-03 ENCOUNTER — Encounter: Payer: Self-pay | Admitting: Cardiovascular Disease

## 2020-10-03 VITALS — BP 128/80 | HR 82 | Ht 66.0 in | Wt 210.8 lb

## 2020-10-03 DIAGNOSIS — R001 Bradycardia, unspecified: Secondary | ICD-10-CM

## 2020-10-03 NOTE — Patient Instructions (Addendum)
Medication Instructions:  Your physician recommends that you continue on your current medications as directed. Please refer to the Current Medication list given to you today.  *If you need a refill on your cardiac medications before your next appointment, please call your pharmacy*  Lab Work: If you have labs (blood work) drawn today and your tests are completely normal, you will receive your results only by: Marland Kitchen MyChart Message (if you have MyChart) OR . A paper copy in the mail If you have any lab test that is abnormal or we need to change your treatment, we will call you to review the results.  Testing/Procedures: None ordered today.  Follow-Up: At [facility], you and your health needs are our priority.  As part of our continuing mission to provide you with exceptional heart care, we have created designated Provider Care Teams.  These Care Teams include your primary Cardiologist (physician) and Advanced Practice Providers (APPs -  Physician Assistants and Nurse Practitioners) who all work together to provide you with the care you need, when you need it.  We recommend signing up for the patient portal called "MyChart".  Sign up information is provided on this After Visit Summary.  MyChart is used to connect with patients for Virtual Visits (Telemedicine).  Patients are able to view lab/test results, encounter notes, upcoming appointments, etc.  Non-urgent messages can be sent to your provider as well.   To learn more about what you can do with MyChart, go to [email or web address].    Your next appointment:   12 month(s)  The format for your next appointment:   In Person  Provider:   You may see Jenkins Rouge, MD or one of the following Advanced Practice Providers on your designated Care Team:    Truitt Merle, NP  Cecilie Kicks, NP  Kathyrn Drown, NP

## 2020-10-17 DIAGNOSIS — M5416 Radiculopathy, lumbar region: Secondary | ICD-10-CM | POA: Diagnosis not present

## 2020-10-23 DIAGNOSIS — K580 Irritable bowel syndrome with diarrhea: Secondary | ICD-10-CM | POA: Diagnosis not present

## 2020-10-23 DIAGNOSIS — R7303 Prediabetes: Secondary | ICD-10-CM | POA: Diagnosis not present

## 2020-10-23 DIAGNOSIS — R7401 Elevation of levels of liver transaminase levels: Secondary | ICD-10-CM | POA: Diagnosis not present

## 2020-10-23 DIAGNOSIS — I129 Hypertensive chronic kidney disease with stage 1 through stage 4 chronic kidney disease, or unspecified chronic kidney disease: Secondary | ICD-10-CM | POA: Diagnosis not present

## 2020-10-23 DIAGNOSIS — Z1389 Encounter for screening for other disorder: Secondary | ICD-10-CM | POA: Diagnosis not present

## 2020-10-23 DIAGNOSIS — K219 Gastro-esophageal reflux disease without esophagitis: Secondary | ICD-10-CM | POA: Diagnosis not present

## 2020-10-23 DIAGNOSIS — N1831 Chronic kidney disease, stage 3a: Secondary | ICD-10-CM | POA: Diagnosis not present

## 2020-10-23 DIAGNOSIS — E7800 Pure hypercholesterolemia, unspecified: Secondary | ICD-10-CM | POA: Diagnosis not present

## 2020-10-23 DIAGNOSIS — Z0000 Encounter for general adult medical examination without abnormal findings: Secondary | ICD-10-CM | POA: Diagnosis not present

## 2020-10-23 DIAGNOSIS — I4510 Unspecified right bundle-branch block: Secondary | ICD-10-CM | POA: Diagnosis not present

## 2020-11-04 DIAGNOSIS — D692 Other nonthrombocytopenic purpura: Secondary | ICD-10-CM | POA: Diagnosis not present

## 2020-11-04 DIAGNOSIS — L718 Other rosacea: Secondary | ICD-10-CM | POA: Diagnosis not present

## 2020-11-04 DIAGNOSIS — D2271 Melanocytic nevi of right lower limb, including hip: Secondary | ICD-10-CM | POA: Diagnosis not present

## 2020-11-04 DIAGNOSIS — L570 Actinic keratosis: Secondary | ICD-10-CM | POA: Diagnosis not present

## 2020-11-04 DIAGNOSIS — D2361 Other benign neoplasm of skin of right upper limb, including shoulder: Secondary | ICD-10-CM | POA: Diagnosis not present

## 2020-11-04 DIAGNOSIS — L814 Other melanin hyperpigmentation: Secondary | ICD-10-CM | POA: Diagnosis not present

## 2020-11-04 DIAGNOSIS — L821 Other seborrheic keratosis: Secondary | ICD-10-CM | POA: Diagnosis not present

## 2020-11-13 DIAGNOSIS — M4316 Spondylolisthesis, lumbar region: Secondary | ICD-10-CM | POA: Diagnosis not present

## 2020-11-13 DIAGNOSIS — M5416 Radiculopathy, lumbar region: Secondary | ICD-10-CM | POA: Diagnosis not present

## 2020-11-13 DIAGNOSIS — M48061 Spinal stenosis, lumbar region without neurogenic claudication: Secondary | ICD-10-CM | POA: Diagnosis not present

## 2020-12-05 ENCOUNTER — Other Ambulatory Visit: Payer: Self-pay | Admitting: Cardiovascular Disease

## 2021-01-20 DIAGNOSIS — M5416 Radiculopathy, lumbar region: Secondary | ICD-10-CM | POA: Diagnosis not present

## 2021-01-28 DIAGNOSIS — E7800 Pure hypercholesterolemia, unspecified: Secondary | ICD-10-CM | POA: Diagnosis not present

## 2021-02-10 DIAGNOSIS — M4316 Spondylolisthesis, lumbar region: Secondary | ICD-10-CM | POA: Diagnosis not present

## 2021-02-10 DIAGNOSIS — M5416 Radiculopathy, lumbar region: Secondary | ICD-10-CM | POA: Diagnosis not present

## 2021-02-10 DIAGNOSIS — M47816 Spondylosis without myelopathy or radiculopathy, lumbar region: Secondary | ICD-10-CM | POA: Diagnosis not present

## 2021-02-10 DIAGNOSIS — M48061 Spinal stenosis, lumbar region without neurogenic claudication: Secondary | ICD-10-CM | POA: Diagnosis not present

## 2021-02-11 DIAGNOSIS — Z1231 Encounter for screening mammogram for malignant neoplasm of breast: Secondary | ICD-10-CM | POA: Diagnosis not present

## 2021-02-26 DIAGNOSIS — K219 Gastro-esophageal reflux disease without esophagitis: Secondary | ICD-10-CM | POA: Diagnosis not present

## 2021-02-26 DIAGNOSIS — K580 Irritable bowel syndrome with diarrhea: Secondary | ICD-10-CM | POA: Diagnosis not present

## 2021-03-19 DIAGNOSIS — L718 Other rosacea: Secondary | ICD-10-CM | POA: Diagnosis not present

## 2021-03-19 IMAGING — DX DG CLAVICLE*R*
2 series · 2 of 2 positions shown · non-contrast
Comparison: None.

CLINICAL DATA: Fall.  Right clavicle pain

EXAM:
RIGHT CLAVICLE - 2+ VIEWS

[clavicle ap]
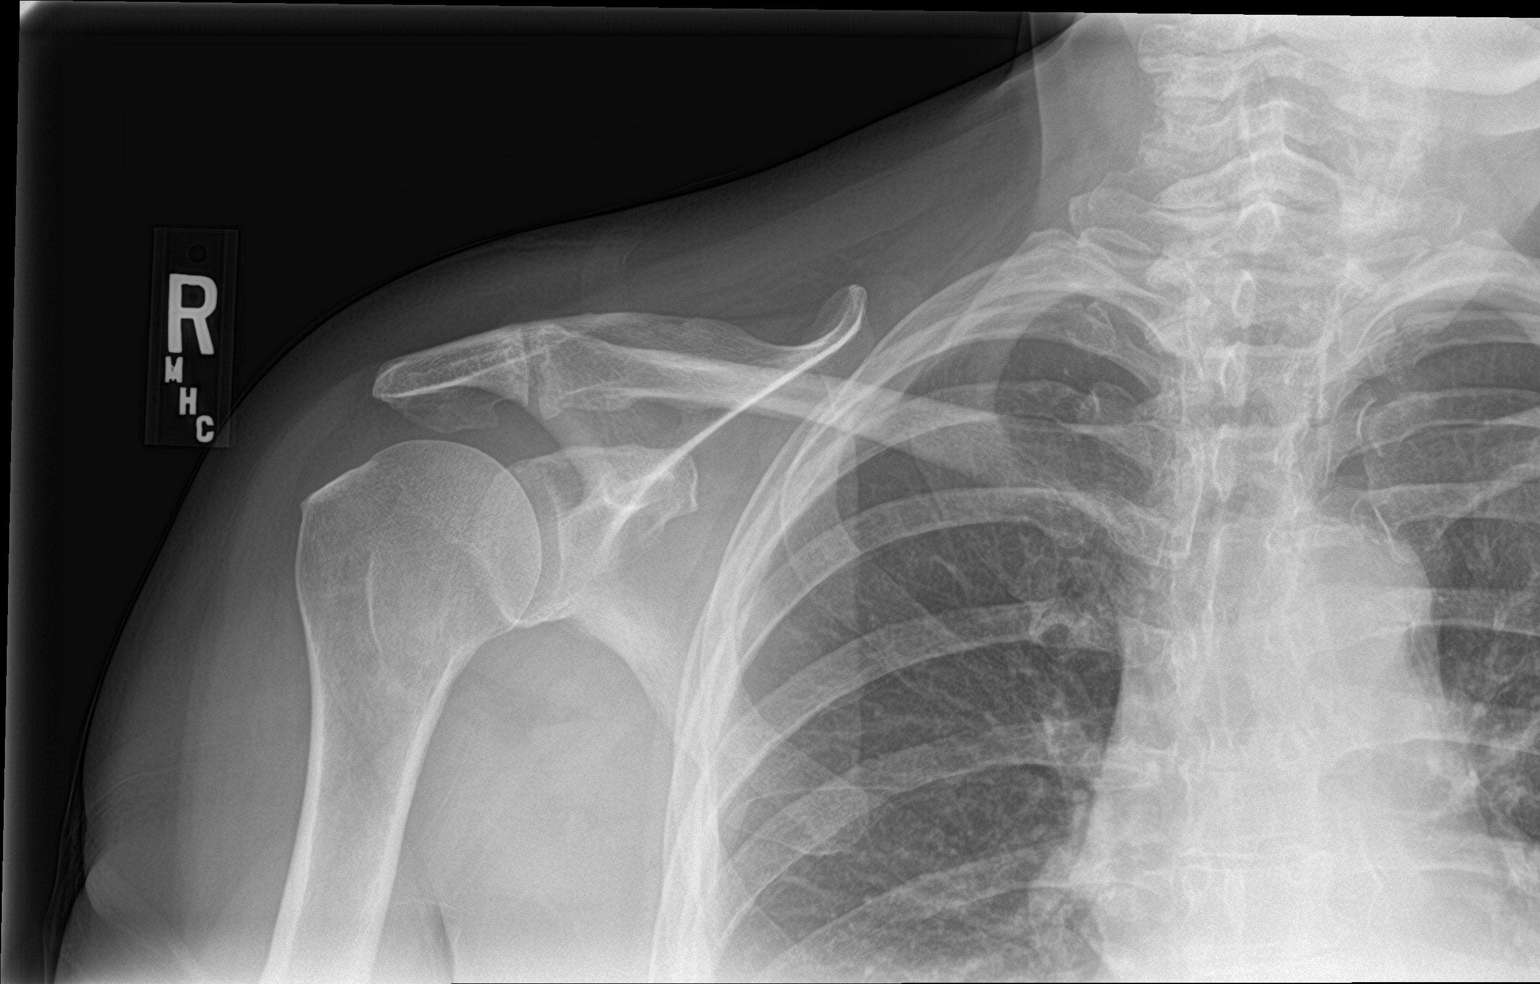

[clavicle axial]
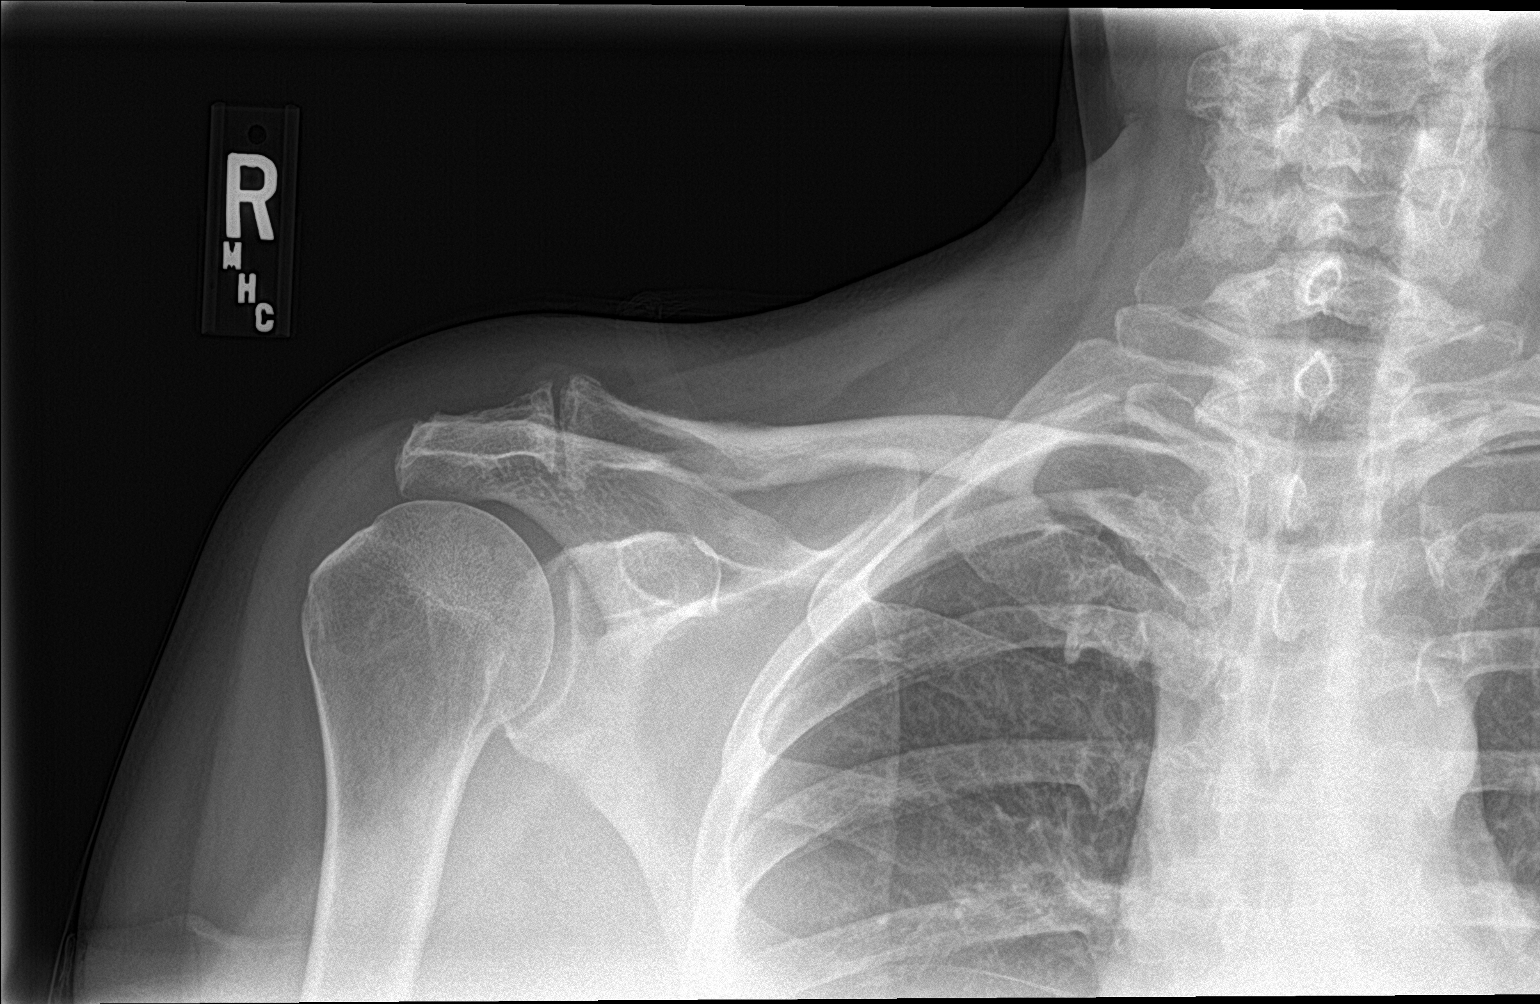

[2 of 2 positions shown; findings below may reference images not displayed]

FINDINGS: Negative for fracture. Normal shoulder. AC degenerative change with
spurring.
IMPRESSION: Negative for fracture.

## 2021-03-24 DIAGNOSIS — H40003 Preglaucoma, unspecified, bilateral: Secondary | ICD-10-CM | POA: Diagnosis not present

## 2021-04-07 DIAGNOSIS — M5416 Radiculopathy, lumbar region: Secondary | ICD-10-CM | POA: Diagnosis not present

## 2021-04-22 DIAGNOSIS — I129 Hypertensive chronic kidney disease with stage 1 through stage 4 chronic kidney disease, or unspecified chronic kidney disease: Secondary | ICD-10-CM | POA: Diagnosis not present

## 2021-04-22 DIAGNOSIS — R7303 Prediabetes: Secondary | ICD-10-CM | POA: Diagnosis not present

## 2021-04-22 DIAGNOSIS — E7800 Pure hypercholesterolemia, unspecified: Secondary | ICD-10-CM | POA: Diagnosis not present

## 2021-04-22 DIAGNOSIS — M545 Low back pain, unspecified: Secondary | ICD-10-CM | POA: Diagnosis not present

## 2021-04-22 DIAGNOSIS — Z23 Encounter for immunization: Secondary | ICD-10-CM | POA: Diagnosis not present

## 2021-04-22 DIAGNOSIS — D692 Other nonthrombocytopenic purpura: Secondary | ICD-10-CM | POA: Diagnosis not present

## 2021-04-22 DIAGNOSIS — N1831 Chronic kidney disease, stage 3a: Secondary | ICD-10-CM | POA: Diagnosis not present

## 2021-04-29 DIAGNOSIS — S81801A Unspecified open wound, right lower leg, initial encounter: Secondary | ICD-10-CM | POA: Diagnosis not present

## 2021-05-08 DIAGNOSIS — M48061 Spinal stenosis, lumbar region without neurogenic claudication: Secondary | ICD-10-CM | POA: Diagnosis not present

## 2021-05-08 DIAGNOSIS — R030 Elevated blood-pressure reading, without diagnosis of hypertension: Secondary | ICD-10-CM | POA: Diagnosis not present

## 2021-05-08 DIAGNOSIS — Z6832 Body mass index (BMI) 32.0-32.9, adult: Secondary | ICD-10-CM | POA: Diagnosis not present

## 2021-05-08 DIAGNOSIS — M5416 Radiculopathy, lumbar region: Secondary | ICD-10-CM | POA: Diagnosis not present

## 2021-05-19 ENCOUNTER — Other Ambulatory Visit: Payer: Self-pay

## 2021-05-19 ENCOUNTER — Encounter (HOSPITAL_BASED_OUTPATIENT_CLINIC_OR_DEPARTMENT_OTHER): Payer: Medicare Other | Attending: Internal Medicine | Admitting: Internal Medicine

## 2021-05-19 DIAGNOSIS — L97818 Non-pressure chronic ulcer of other part of right lower leg with other specified severity: Secondary | ICD-10-CM | POA: Diagnosis not present

## 2021-05-19 DIAGNOSIS — W2209XA Striking against other stationary object, initial encounter: Secondary | ICD-10-CM | POA: Diagnosis not present

## 2021-05-19 DIAGNOSIS — S8011XA Contusion of right lower leg, initial encounter: Secondary | ICD-10-CM | POA: Diagnosis not present

## 2021-05-19 DIAGNOSIS — I872 Venous insufficiency (chronic) (peripheral): Secondary | ICD-10-CM | POA: Diagnosis not present

## 2021-05-19 DIAGNOSIS — L97812 Non-pressure chronic ulcer of other part of right lower leg with fat layer exposed: Secondary | ICD-10-CM | POA: Diagnosis not present

## 2021-05-19 NOTE — Progress Notes (Signed)
KAMEAH, RAWL ([id number]) Visit Report for 05/19/2021 Abuse/Suicide Risk Screen Details Patient Name: Date of Service: Jillene Bucks, [place] NDRA W. 05/19/2021 2:00 PM Medical Record Number: [id number] Patient Account Number: [id number] Date of Birth/Sex: Treating RN: [birth year] (75 y.o. [name] Primary Care Alexianna Nachreiner: Kelton Pillar Other Clinician: Referring Kalden Wanke: Treating Chamya Hunton/Extender: Lesle Chris in Treatment: 0 Abuse/Suicide Risk Screen Items Answer ABUSE RISK SCREEN: Has anyone close to you tried to hurt or harm you recentlyo No Do you feel uncomfortable with anyone in your familyo No Has anyone forced you do things that you didnt want to doo No Electronic Signature(s) Signed: 05/19/2021 5:38:29 PM By: Lorrin Jackson Entered By: Lorrin Jackson on 05/19/2021 14:27:40 -------------------------------------------------------------------------------- Activities of Daily Living Details Patient Name: Date of Service: Karin Golden NDRA W. 05/19/2021 2:00 PM Medical Record Number: [id number] Patient Account Number: [id number] Date of Birth/Sex: Treating RN: 1946/09/10 (75 y.o. [name] Primary Care Perina Salvaggio: Kelton Pillar Other Clinician: Referring Mortimer Bair: Treating Summar Mcglothlin/Extender: Lesle Chris in Treatment: 0 Activities of Daily Living Items Answer Activities of Daily Living (Please select one for each item) Drive Automobile Completely Able T Medications ake Completely Able Use T elephone Completely Able Care for Appearance Completely Able Use T oilet Completely Able Bath / Shower Completely Able Dress Self Completely Able Feed Self Completely Able Walk Completely Able Get In / Out Bed Completely Able Housework Completely Able Prepare Meals Completely Able Handle Money Completely Able Shop for Self Completely Able Electronic Signature(s) Signed: 05/19/2021 5:38:29 PM By: Lorrin Jackson Entered By: Lorrin Jackson on 05/19/2021 14:28:02 -------------------------------------------------------------------------------- Education Screening Details Patient Name: Date of Service: [name], [name] 05/19/2021 2:00 PM Medical Record Number: [id number] Patient Account Number: [id number] Date of Birth/Sex: Treating RN: [birth year] (75 y.o. [name] Primary Care Jalayiah Bibian: Kelton Pillar Other Clinician: Referring Sophi Calligan: Treating Randeep Biondolillo/Extender: Lesle Chris in Treatment: 0 Primary Learner Assessed: Patient Learning Preferences/Education Level/Primary Language Learning Preference: Explanation, Demonstration, Printed Material Highest Education Level: High School Preferred Language: English Cognitive Barrier Language Barrier: No Translator Needed: No Memory Deficit: No Emotional Barrier: No Cultural/Religious Beliefs Affecting Medical Care: No Physical Barrier Impaired Vision: Yes Glasses Impaired Hearing: No Decreased Hand dexterity: No Knowledge/Comprehension Knowledge Level: High Comprehension Level: High Ability to understand written instructions: High Ability to understand verbal instructions: High Motivation Anxiety Level: Calm Cooperation: Cooperative Education Importance: Acknowledges Need Interest in Health Problems: Asks Questions Perception: Coherent Willingness to Engage in Self-Management High Activities: Readiness to Engage in Self-Management High Activities: Electronic Signature(s) Signed: 05/19/2021 5:38:29 PM By: Lorrin Jackson Entered By: Lorrin Jackson on 05/19/2021 14:29:25 -------------------------------------------------------------------------------- Fall Risk Assessment Details Patient Name: Date of Service: [name], [place]. 05/19/2021 2:00 PM Medical Record Number: [id number] Patient Account Number: [id number] Date of Birth/Sex: Treating RN: 1946/07/23 (75 y.o. [name] Primary Care Angelina Neece: Kelton Pillar Other Clinician: Referring Brittania Sudbeck: Treating Shellie Rogoff/Extender: Lesle Chris in Treatment: 0 Fall Risk Assessment Items Have you had 2 or more falls in the last 12 monthso 0 No Have you had any fall that resulted in injury in the last 12 monthso 0 No FALLS RISK SCREEN History of falling - immediate or within 3 months 0 No Secondary diagnosis (Do you have 2 or more medical diagnoseso) 0 No Ambulatory aid None/bed rest/wheelchair/nurse 0 Yes Crutches/cane/walker 0 No Furniture 0 No Intravenous therapy Access/Saline/Heparin Lock 0 No Gait/Transferring Normal/ bed rest/ wheelchair 0 Yes Weak (  short steps with or without shuffle, stooped but able to lift head while walking, may seek 0 No support from furniture) Impaired (short steps with shuffle, may have difficulty arising from chair, head down, impaired 0 No balance) Mental Status Oriented to own ability 0 Yes Electronic Signature(s) Signed: 05/19/2021 5:38:29 PM By: Lorrin Jackson Entered By: Lorrin Jackson on 05/19/2021 14:28:19 -------------------------------------------------------------------------------- Foot Assessment Details Patient Name: Date of Service: [name], [place]. 05/19/2021 2:00 PM Medical Record Number: [id number] Patient Account Number: [id number] Date of Birth/Sex: Treating RN: [birth year] (75 y.o. [name] Primary Care Zoejane Gaulin: Kelton Pillar Other Clinician: Referring Daymion Nazaire: Treating Deny Chevez/Extender: Lesle Chris in Treatment: 0 Foot Assessment Items Site Locations + = Sensation present, - = Sensation absent, C = Callus, U = Ulcer R = Redness, W = Warmth, M = Maceration, PU = Pre-ulcerative lesion F = Fissure, S = Swelling, D = Dryness Assessment Right: Left: Other Deformity: No No Prior Foot Ulcer: No No Prior Amputation: No No Charcot Joint: No No Ambulatory Status: Ambulatory  Without Help Gait: Steady Electronic Signature(s) Signed: 05/19/2021 5:38:29 PM By: Lorrin Jackson Entered By: Lorrin Jackson on 05/19/2021 14:34:26 -------------------------------------------------------------------------------- Nutrition Risk Screening Details Patient Name: Date of Service: [name], [name] 05/19/2021 2:00 PM Medical Record Number: [id number] Patient Account Number: [id number] Date of Birth/Sex: Treating RN: 21-Jan-1946 (75 y.o. [name] Primary Care Cumi Sanagustin: Kelton Pillar Other Clinician: Referring Rondia Higginbotham: Treating Elana Jian/Extender: Lesle Chris in Treatment: 0 Height (in): 66 Weight (lbs): 205 Body Mass Index (BMI): 33.1 Nutrition Risk Screening Items Score Screening NUTRITION RISK SCREEN: I have an illness or condition that made me change the kind and/or amount of food I eat 0 No I eat fewer than two meals per day 0 No I eat few fruits and vegetables, or milk products 0 No I have three or more drinks of beer, liquor or wine almost every day 0 No I have tooth or mouth problems that make it hard for me to eat 0 No I don't always have enough money to buy the food I need 0 No I eat alone most of the time 0 No I take three or more different prescribed or over-the-counter drugs a day 1 Yes Without wanting to, I have lost or gained 10 pounds in the last six months 0 No I am not always physically able to shop, cook and/or feed myself 0 No Nutrition Protocols Good Risk Protocol 0 No interventions needed Moderate Risk Protocol High Risk Proctocol Risk Level: Good Risk Score: 1 Electronic Signature(s) Signed: 05/19/2021 5:38:29 PM By: Lorrin Jackson Entered By: Lorrin Jackson on 05/19/2021 14:28:36

## 2021-05-21 NOTE — Progress Notes (Signed)
THARON, KITCH ([id number]) Visit Report for 05/19/2021 Allergy List Details Patient Name: Date of Service: Abigail Guzman,  Abigail W. 05/19/2021 2:00 PM Medical Record Number: [id number] Patient Account Number: [id number] Date of Birth/Sex: Treating RN: [birth year] (75 y.o. Abigail Guzman Primary Care Abigail Guzman: Abigail Guzman Other Clinician: Referring Abigail Guzman: Treating Abigail Guzman/Extender: Abigail Guzman in Treatment: 0 Allergies Active Allergies Sulfa (Sulfonamide Antibiotics) Reaction: rash Allergy Notes Electronic Signature(s) Signed: 05/19/2021 5:38:29 PM By: Lorrin Jackson Entered By: Lorrin Jackson on 05/19/2021 14:22:57 -------------------------------------------------------------------------------- Arrival Information Details Patient Name: Date of Service: Abigail [name], SA Abigail W. 05/19/2021 2:00 PM Medical Record Number: [id number] Patient Account Number: [id number] Date of Birth/Sex: Treating RN: [birth year] (75 y.o. Abigail Guzman Primary Care Abigail Guzman: Abigail Guzman Other Clinician: Referring Anaka Beazer: Treating Abigail Guzman/Extender: Abigail Guzman in Treatment: 0 Visit Information Patient Arrived: Ambulatory Arrival Time: 14:15 Transfer Assistance: None Patient Identification Verified: Yes Secondary Verification Process Completed: Yes Patient Requires Transmission-Based Precautions: No Patient Has Alerts: No Electronic Signature(s) Signed: 05/19/2021 5:38:29 PM By: Lorrin Jackson Entered By: Lorrin Jackson on 05/19/2021 14:20:58 -------------------------------------------------------------------------------- Clinic Level of Care Assessment Details Patient Name: Date of Service: Abigail Abigail Guzman Abigail W. 05/19/2021 2:00 PM Medical Record Number: [id number] Patient Account Number: [id number] Date of Birth/Sex: Treating RN: [birth year] (75 y.o. Abigail Guzman Primary Care Albena Comes: Abigail Guzman Other Clinician: Referring  Damonie Guzman: Treating Abigail Guzman/Extender: Abigail Guzman in Treatment: 0 Clinic Level of Care Assessment Items TOOL 1 Quantity Score X- 1 0 Use when EandM and Procedure is performed on INITIAL visit ASSESSMENTS - Nursing Assessment / Reassessment X- 1 20 General Physical Exam (combine w/ comprehensive assessment (listed just below) when performed on new pt. evals) X- 1 25 Comprehensive Assessment (HX, ROS, Risk Assessments, Wounds Hx, etc.) ASSESSMENTS - Wound and Skin Assessment / Reassessment []  - 0 Dermatologic / Skin Assessment (not related to wound area) ASSESSMENTS - Ostomy and/or Continence Assessment and Care []  - 0 Incontinence Assessment and Management []  - 0 Ostomy Care Assessment and Management (repouching, etc.) PROCESS - Coordination of Care X - Simple Patient / Family Education for ongoing care 1 15 []  - 0 Complex (extensive) Patient / Family Education for ongoing care X- 1 10 Staff obtains Programmer, systems, Records, T Results / Process Orders est []  - 0 Staff telephones HHA, Nursing Homes / Clarify orders / etc []  - 0 Routine Transfer to another Facility (non-emergent condition) []  - 0 Routine Hospital Admission (non-emergent condition) X- 1 15 New Admissions / Biomedical engineer / Ordering NPWT Apligraf, etc. , []  - 0 Emergency Hospital Admission (emergent condition) PROCESS - Special Needs []  - 0 Pediatric / Minor Patient Management []  - 0 Isolation Patient Management []  - 0 Hearing / Language / Visual special needs []  - 0 Assessment of Community assistance (transportation, D/C planning, etc.) []  - 0 Additional assistance / Altered mentation []  - 0 Support Surface(s) Assessment (bed, cushion, seat, etc.) INTERVENTIONS - Miscellaneous []  - 0 External ear exam []  - 0 Patient Transfer (multiple staff / Civil Service fast streamer / Similar devices) []  - 0 Simple Staple / Suture removal (25 or less) []  - 0 Complex Staple / Suture removal (26  or more) []  - 0 Hypo/Hyperglycemic Management (do not check if billed separately) X- 1 15 Ankle / Brachial Index (ABI) - do not check if billed separately Has the patient been seen at the hospital within the last three years: Yes Total Score: 100 Level Of Care: New/Established -  Level 3 Electronic Signature(s) Signed: 05/21/2021 5:57:05 PM By: Abigail Hurst RN, BSN Entered By: Abigail Guzman on 05/19/2021 15:22:08 -------------------------------------------------------------------------------- Compression Therapy Details Patient Name: Date of Service: Abigail [name], SA Abigail W. 05/19/2021 2:00 PM Medical Record Number: [id number] Patient Account Number: [id number] Date of Birth/Sex: Treating RN: Gu[birth year] (75 y.o. Abigail Guzman Primary Care Omaria Plunk: Abigail Guzman Other Clinician: Referring Abigail Guzman: Treating Abigail Guzman/Extender: Abigail Guzman in Treatment: 0 Compression Therapy Performed for Wound Assessment: Wound #1 Right,Lateral Lower Leg Performed By: Clinician Abigail Hurst, RN Compression Type: Three Layer Post Procedure Diagnosis Same as Pre-procedure Electronic Signature(s) Signed: 05/21/2021 5:57:05 PM By: Abigail Hurst RN, BSN Entered By: Abigail Guzman on 05/19/2021 15:19:26 -------------------------------------------------------------------------------- Encounter Discharge Information Details Patient Name: Date of Service: Abigail [name], SA Abigail W. 05/19/2021 2:00 PM Medical Record Number: [id number] Patient Account Number: [id number] Date of Birth/Sex: Treating RN: Abigail Guzman (75 y.o. Abigail Guzman Primary Care Marquel Pottenger: Abigail Guzman Other Clinician: Referring Abigail Guzman: Treating Abigail Guzman/Extender: Abigail Guzman in Treatment: 0 Encounter Discharge Information Items Post Procedure Vitals Discharge Condition: Stable Temperature (F): 98.7 Ambulatory Status: Ambulatory Pulse (bpm): 66 Discharge Destination:  Home Respiratory Rate (breaths/min): 16 Transportation: Private Auto Blood Pressure (mmHg): 137/82 Accompanied By: self Schedule Follow-up Appointment: Yes Clinical Summary of Care: Electronic Signature(s) Signed: 05/19/2021 5:44:33 PM By: Abigail Guzman Entered By: Abigail Guzman on 05/19/2021 17:39:17 -------------------------------------------------------------------------------- Lower Extremity Assessment Details Patient Name: Date of Service: Abigail Abigail Guzman Abigail W. 05/19/2021 2:00 PM Medical Record Number: [id number] Patient Account Number: [id number] Date of Birth/Sex: Treating RN: [birth year]zman (75 y.o. Abigail Guzman Primary Care Lynsie Mcwatters: Abigail Guzman Other Clinician: Referring Takeia Ciaravino: Treating Afifa Truax/Extender: Abigail Guzman in Treatment: 0 Edema Assessment Assessed: Shirlyn Goltz: Yes] Patrice Paradise: Yes] Edema: [Left: Yes] [Right: Yes] Calf Left: Right: Point of Measurement: From Medial Instep 39 cm 37 cm Ankle Left: Right: Point of Measurement: From Medial Instep 23.7 cm 23 cm Vascular Assessment Pulses: Dorsalis Pedis Palpable: [Left:Yes] [Right:Yes] Blood Pressure: Brachial: [Left:137] [Right:137] Ankle: [Left:Dorsalis Pedis: 142 1.04] [Right:Dorsalis Pedis: 140 1.02] Electronic Signature(s) Signed: 05/19/2021 5:38:29 PM By: Lorrin Jackson Entered By: Lorrin Jackson on 05/19/2021 14:54:28 -------------------------------------------------------------------------------- Multi Wound Chart Details Patient Name: Date of Service: Abigail [name], SA [place] W. 05/19/2021 2:00 PM Medical Record Number: [id number] Patient Account Number: [id number] Date of Birth/Sex: Treating RN: [birth year] (75 y.o. Abigail Guzman Primary Care Felita Bump: Abigail Guzman Other Clinician: Referring Jaymason Ledesma: Treating Viera Okonski/Extender: Abigail Guzman in Treatment: 0 Vital Signs Height(in): 26 Pulse(bpm): 42 Weight(lbs): 205 Blood  Pressure(mmHg): 137/82 Body Mass Index(BMI): 33 Temperature(F): 98.7 Respiratory Rate(breaths/min): 16 Photos: [1:No Photos Right, Anterior Lower Leg] [N/A:N/A N/A] Wound Location: [1:Trauma] [N/A:N/A] Wounding Event: [1:Trauma, Other] [N/A:N/A] Primary Etiology: [1:Osteoarthritis] [N/A:N/A] Comorbid History: [1:04/28/2021] [N/A:N/A] Date Acquired: [1:0] [N/A:N/A] Weeks of Treatment: [1:Open] [N/A:N/A] Wound Status: [1:3.1x2.7x0.2] [N/A:N/A] Measurements L x W x D (cm) [1:6.574] [N/A:N/A] A (cm) : rea [1:1.315] [N/A:N/A] Volume (cm) : [1:Full Thickness Without Exposed] [N/A:N/A] Classification: [1:Support Structures Medium] [N/A:N/A] Exudate Amount: [1:Serosanguineous] [N/A:N/A] Exudate Type: [1:red, brown] [N/A:N/A] Exudate Color: [1:Distinct, outline attached] [N/A:N/A] Wound Margin: [1:Large (67-100%)] [N/A:N/A] Granulation Amount: [1:Red] [N/A:N/A] Granulation Quality: [1:Small (1-33%)] [N/A:N/A] Necrotic Amount: [1:Fat Layer (Subcutaneous Tissue): Yes N/A] Exposed Structures: [1:Fascia: No Tendon: No Muscle: No Joint: No Bone: No None] [N/A:N/A] Epithelialization: [1:Debridement - Excisional] [N/A:N/A] Debridement: Pre-procedure Verification/Time Out 15:17 [N/A:N/A] Taken: [1:Subcutaneous, Slough] [N/A:N/A] Tissue Debrided: [1:Skin/Subcutaneous Tissue] [N/A:N/A] Level: [1:8.37] [N/A:N/A] Debridement A (sq cm): [1:rea Curette] [N/A:N/A] Instrument: [1:Minimum] [  N/A:N/A] Bleeding: [1:Pressure] [N/A:N/A] Hemostasis A chieved: [1:0] [N/A:N/A] Procedural Pain: [1:0] [N/A:N/A] Post Procedural Pain: [1:Procedure was tolerated well] [N/A:N/A] Debridement Treatment Response: [1:3.1x2.7x0.2] [N/A:N/A] Post Debridement Measurements L x W x D (cm) [1:1.315] [N/A:N/A] Post Debridement Volume: (cm) [1:Compression Therapy] [N/A:N/A] Procedures Performed: [1:Debridement] Treatment Notes Electronic Signature(s) Signed: 05/19/2021 4:50:54 PM By: Linton Ham MD Signed: 05/21/2021  5:57:05 PM By: Abigail Hurst RN, BSN Entered By: Linton Ham on 05/19/2021 15:41:32 -------------------------------------------------------------------------------- Multi-Disciplinary Care Plan Details Patient Name: Date of Service: Abigail Guzman, SA Abigail W. 05/19/2021 2:00 PM Medical Record Number: [id number] Patient Account Number: [id number] Date of Birth/Sex: Treating RN: [birth year]man (75 y.o. Abigail Guzman Primary Care Kimberley Speece: Abigail Guzman Other Clinician: Referring Jahleah Mariscal: Treating Asuna Peth/Extender: Abigail Guzman in Treatment: 0 Multidisciplinary Care Plan reviewed with physician Active Inactive Venous Leg Ulcer Nursing Diagnoses: Knowledge deficit related to disease process and management Goals: Patient will maintain optimal edema control Date Initiated: 05/19/2021 Target Resolution Date: 06/20/2021 Goal Status: Active Patient/caregiver will verbalize understanding of disease process and disease management Date Initiated: 05/19/2021 Target Resolution Date: 06/20/2021 Goal Status: Active Interventions: Assess peripheral edema status every visit. Compression as ordered Provide education on venous insufficiency Notes: Wound/Skin Impairment Nursing Diagnoses: Impaired tissue integrity Knowledge deficit related to ulceration/compromised skin integrity Goals: Patient/caregiver will verbalize understanding of skin care regimen Date Initiated: 05/19/2021 Target Resolution Date: 06/20/2021 Goal Status: Active Ulcer/skin breakdown will have a volume reduction of 30% by week 4 Date Initiated: 05/19/2021 Target Resolution Date: 06/20/2021 Goal Status: Active Interventions: Assess patient/caregiver ability to obtain necessary supplies Assess patient/caregiver ability to perform ulcer/skin care regimen upon admission and as needed Assess ulceration(s) every visit Provide education on ulcer and skin care Notes: Electronic Signature(s) Signed: 05/21/2021  5:57:05 PM By: Abigail Hurst RN, BSN Entered By: Abigail Guzman on 05/19/2021 15:17:32 -------------------------------------------------------------------------------- Pain Assessment Details Patient Name: Date of Service: Abigail [name], [place] W. 05/19/2021 2:00 PM Medical Record Number: [id number] Patient Account Number: [id number] Date of Birth/Sex: Treating RN: [birth year] (75 y.o. Abigail Guzman Primary Care Ronya Gilcrest: Abigail Guzman Other Clinician: Referring Lita Flynn: Treating Michaell Grider/Extender: Abigail Guzman in Treatment: 0 Active Problems Location of Pain Severity and Description of Pain Patient Has Paino Yes Site Locations With Dressing Change: Yes Duration of the Pain. Constant / Intermittento Intermittent Rate the pain. Current Pain Level: 3 Character of Pain Describe the Pain: Tender, Throbbing Pain Management and Medication Current Pain Management: Medication: Yes Cold Application: No Rest: Yes Massage: No Activity: No T.E.N.S.: No Heat Application: No Leg drop or elevation: No Is the Current Pain Management Adequate: Inadequate How does your wound impact your activities of daily livingo Sleep: No Bathing: No Appetite: No Relationship With Others: No Bladder Continence: No Emotions: No Bowel Continence: No Work: No Toileting: No Drive: No Dressing: No Hobbies: No Electronic Signature(s) Signed: 05/19/2021 5:38:29 PM By: Lorrin Jackson Entered By: Lorrin Jackson on 05/19/2021 14:40:43 -------------------------------------------------------------------------------- Patient/Caregiver Education Details Patient Name: Date of Service: Abigail [name], SA Abigail W. 6/6/2022andnbsp2:00 PM Medical Record Number: [id number] Patient Account Number: [id number] Date of Birth/Gender: Treating RN: Guzman[birth year] (75 y.o. Abigail Guzman Primary Care Physician: Abigail Guzman Other Clinician: Referring Physician: Treating Physician/Extender:  Abigail Guzman in Treatment: 0 Education Assessment Education Provided To: Patient Education Topics Provided Venous: Methods: Explain/Verbal Responses: State content correctly Wound/Skin Impairment: Methods: Explain/Verbal Responses: State content correctly Electronic Signature(s) Signed: 05/21/2021 5:57:05 PM By: Abigail Hurst RN, BSN Entered By: Abigail Guzman on 05/19/2021 15:21:05 --------------------------------------------------------------------------------  Wound Assessment Details Patient Name: Date of Service: Abigail Guzman, [place] Abigail W. 05/19/2021 2:00 PM Medical Record Number: [id number] Patient Account Number: [id number] Date of Birth/Sex: Treating RN: [birth year]uzman (75 y.o. Abigail Guzman Primary Care [place]: Abigail Guzman Other Clinician: Referring Eufemia Prindle: Treating Shanikka Wonders/Extender: Abigail Guzman in Treatment: 0 Wound Status Wound Number: 1 Primary Etiology: Venous Leg Ulcer Wound Location: Right, Lateral Lower Leg Wound Status: Open Wounding Event: Trauma Comorbid History: Osteoarthritis Date Acquired: 04/28/2021 Weeks Of Treatment: 0 Clustered Wound: No Photos Wound Measurements Length: (cm) 3.1 Width: (cm) 2.7 Depth: (cm) 0.2 Area: (cm) 6.574 Volume: (cm) 1.315 % Reduction in Area: 0% % Reduction in Volume: 0% Epithelialization: None Tunneling: No Undermining: No Wound Description Classification: Full Thickness Without Exposed Support Structu Wound Margin: Distinct, outline attached Exudate Amount: Medium Exudate Type: Serosanguineous Exudate Color: red, brown res Foul Odor After Cleansing: No Slough/Fibrino Yes Wound Bed Granulation Amount: Large (67-100%) Exposed Structure Granulation Quality: Red Fascia Exposed: No Necrotic Amount: Small (1-33%) Fat Layer (Subcutaneous Tissue) Exposed: Yes Tendon Exposed: No Muscle Exposed: No Joint Exposed: No Bone Exposed: No Treatment  Notes Wound #1 (Lower Leg) Wound Laterality: Right, Lateral Cleanser Soap and Water Discharge Instruction: May shower and wash wound with dial antibacterial soap and water prior to dressing change. Peri-Wound Care Sween Lotion (Moisturizing lotion) Discharge Instruction: Apply moisturizing lotion as directed Topical Primary Dressing Hydrofera Blue Classic Foam, 2x2 in Discharge Instruction: Moisten with saline prior to applying to wound bed Secondary Dressing Woven Gauze Sponge, Non-Sterile 4x4 in Discharge Instruction: Apply over primary dressing as directed. ABD Pad, 8x10 Discharge Instruction: Apply over primary dressing as directed. Secured With Compression Wrap ThreePress (3 layer compression wrap) Discharge Instruction: Apply three layer compression as directed. Compression Stockings Add-Ons Electronic Signature(s) Signed: 05/20/2021 2:13:29 PM By: Sandre Kitty Signed: 05/21/2021 5:57:05 PM By: Abigail Hurst RN, BSN Entered By: Sandre Kitty on 05/19/2021 16:30:08 -------------------------------------------------------------------------------- Vitals Details Patient Name: Date of Service: Abigail MILTO Delane Ginger, [place] W. 05/19/2021 2:00 PM Medical Record Number: [id number] Patient Account Number: [id number] Date of Birth/Sex: Treating RN: [birth year] Guzman (75 y.o. Abigail Guzman Primary Care Mikeyla Music: Abigail Guzman Other Clinician: Referring Isa Kohlenberg: Treating Irfan Veal/Extender: Abigail Guzman in Treatment: 0 Vital Signs Time Taken: 14:20 Temperature (F): 98.7 Height (in): 66 Pulse (bpm): 66 Source: Stated Respiratory Rate (breaths/min): 16 Weight (lbs): 205 Blood Pressure (mmHg): 137/82 Source: Stated Reference Range: 80 - 120 mg / dl Body Mass Index (BMI): 33.1 Electronic Signature(s) Signed: 05/19/2021 5:38:29 PM By: Lorrin Jackson Entered By: Lorrin Jackson on 05/19/2021 14:22:15

## 2021-05-21 NOTE — Progress Notes (Signed)
Abigail Guzman, Abigail Guzman ([id number]) Visit Report for 05/19/2021 Chief Complaint Document Details Patient Name: Date of Service: Abigail Guzman,  Abigail W. 05/19/2021 2:00 PM Medical Record Number: [id number] Patient Account Number: [id number] Date of Birth/Sex: Treating RN: August 11, 1946 (75 y.o. [name] Primary Care Provider: Kelton Pillar Other Clinician: Referring Provider: Treating Provider/Extender: Herma Ard, Duaine Dredge in Treatment: 0 Information Obtained from: Patient Chief Complaint 05/19/2021; patient comes here for review of a wound on the right lateral lower leg secondary to a car door injury Electronic Signature(s) Signed: 05/19/2021 4:50:54 PM By: Linton Ham MD Entered By: Linton Ham on 05/19/2021 15:42:27 -------------------------------------------------------------------------------- Debridement Details Patient Name: Date of Service: Abigail Guzman, [place] W. 05/19/2021 2:00 PM Medical Record Number: [id number] Patient Account Number: [id number] Date of Birth/Sex: Treating RN: 14-Feb-1946 (75 y.o. Benjamine Sprague, Briant Cedar Primary Care Provider: Kelton Pillar Other Clinician: Referring Provider: Treating Provider/Extender: Lesle Chris in Treatment: 0 Debridement Performed for Assessment: Wound #1 Right,Lateral Lower Leg Performed By: Physician Ricard Dillon., MD Debridement Type: Debridement Severity of Tissue Pre Debridement: Fat layer exposed Level of Consciousness (Pre-procedure): Awake and Alert Pre-procedure Verification/Time Out Yes - 15:17 Taken: Start Time: 15:17 T Area Debrided (L x W): otal 3.1 (cm) x 2.7 (cm) = 8.37 (cm) Tissue and other material debrided: Viable, Non-Viable, Slough, Subcutaneous, Slough Level: Skin/Subcutaneous Tissue Debridement Description: Excisional Instrument: Curette Bleeding: Minimum Hemostasis Achieved: Pressure End Time: 15:18 Procedural Pain: 0 Post Procedural Pain: 0 Response  to Treatment: Procedure was tolerated well Level of Consciousness (Post- Awake and Alert procedure): Post Debridement Measurements of Total Wound Length: (cm) 3.1 Width: (cm) 2.7 Depth: (cm) 0.2 Volume: (cm) 1.315 Character of Wound/Ulcer Post Debridement: Requires Further Debridement Severity of Tissue Post Debridement: Fat layer exposed Post Procedure Diagnosis Same as Pre-procedure Electronic Signature(s) Signed: 05/19/2021 4:50:54 PM By: Linton Ham MD Signed: 05/21/2021 5:57:05 PM By: Levan Hurst RN, BSN Entered By: Linton Ham on 05/19/2021 15:41:43 -------------------------------------------------------------------------------- HPI Details Patient Name: Date of Service: Abigail Guzman, Abigail Abigail W. 05/19/2021 2:00 PM Medical Record Number: [id number] Patient Account Number: [id number] Date of Birth/Sex: Treating RN: [birth year] (75 y.o. [name] Primary Care Provider: Kelton Pillar Other Clinician: Referring Provider: Treating Provider/Extender: Lesle Chris in Treatment: 0 History of Present Illness HPI Description: ADMISSION 05/19/2021 This is a fairly healthy 75 year old woman who suffered a traumatic contusion to her right lateral lower leg about 3 weeks ago when her car door hit her leg in her garage. Apparently bled profusely. He saw primary care who referred her to dermatology. She was advised to "clean this aggressively" and was initially applying Neosporin more recently Vaseline. We do not have any of these notes. She was able to give [place] a pictorial history on her phone. Initially a fairly eschared surface. I am not sure if there is a hematoma underneath this but she has been scrubbing at this to remove all the debris from the surface she did a fairly good job. She also reports weeping edema fluid coming out of the surface of the wound. She does not have a wound history. She is not a diabetic Past medical history includes lumbar  radiculopathy, hypertension, bradycardia, gastroesophageal reflux disease, irritable bowel syndrome ABIs in our clinic were 1.02 on the right and 1.04 in the left Electronic Signature(s) Signed: 05/19/2021 4:50:54 PM By: Linton Ham MD Entered By: Linton Ham on 05/19/2021 15:48:58 -------------------------------------------------------------------------------- Physical Exam Details Patient Name: Date of Service: Abigail MILTO N,  Abigail Abigail W. 05/19/2021 2:00 PM Medical Record Number: [id number] Patient Account Number: [id number] Date of Birth/Sex: Treating RN: January 25, 1946 (75 y.o. [name] Primary Care Provider: Kelton Pillar Other Clinician: Referring Provider: Treating Provider/Extender: Lesle Chris in Treatment: 0 Constitutional Sitting or standing Blood Pressure is within target range for patient.. Pulse regular and within target range for patient.Marland Kitchen Respirations regular, non-labored and within target range.. Temperature is normal and within the target range for the patient.Marland Kitchen Appears in no distress. Respiratory work of breathing is normal. Cardiovascular Pedal pulses palpable and strong bilaterally.. Pitting edema in the right leg from about the mid calf maximal at the ankle. Not a lot of evidence of chronic venous insufficiency. Integumentary (Hair, Skin) No primary skin condition is seen. Notes Wound exam; the right lateral lower leg. Wound about the size of a quarter. Under illumination very adherent debris on the surface which I removed with a #5 curette this cleans up quite nicely some weeping edema fluid seen no evidence of surrounding infection Electronic Signature(s) Signed: 05/19/2021 4:50:54 PM By: Linton Ham MD Entered By: Linton Ham on 05/19/2021 15:50:22 -------------------------------------------------------------------------------- Physician Orders Details Patient Name: Date of Service: Abigail Guzman, Abigail Abigail W. 05/19/2021 2:00  PM Medical Record Number: [id number] Patient Account Number: [id number] Date of Birth/Sex: Treating RN: 10/30/1946 (75 y.o. [name] Primary Care Provider: Kelton Pillar Other Clinician: Referring Provider: Treating Provider/Extender: Lesle Chris in Treatment: 0 Verbal / Phone Orders: No Diagnosis Coding Follow-up Appointments Return Appointment in 1 week. Bathing/ Shower/ Hygiene May shower with protection but do not get wound dressing(s) wet. - Use cast protector Edema Control - Lymphedema / SCD / Other Elevate legs to the level of the heart or above for 30 minutes daily and/or when sitting, a frequency of: - throughout the day Avoid standing for long periods of time. Exercise regularly Wound Treatment Wound #1 - Lower Leg Wound Laterality: Right, Lateral Cleanser: Soap and Water 1 x Per Week Discharge Instructions: May shower and wash wound with dial antibacterial soap and water prior to dressing change. Peri-Wound Care: Sween Lotion (Moisturizing lotion) 1 x Per Week Discharge Instructions: Apply moisturizing lotion as directed Prim Dressing: Hydrofera Blue Classic Foam, 2x2 in 1 x Per Week ary Discharge Instructions: Moisten with saline prior to applying to wound bed Secondary Dressing: Woven Gauze Sponge, Non-Sterile 4x4 in 1 x Per Week Discharge Instructions: Apply over primary dressing as directed. Secondary Dressing: ABD Pad, 8x10 1 x Per Week Discharge Instructions: Apply over primary dressing as directed. Compression Wrap: ThreePress (3 layer compression wrap) 1 x Per Week Discharge Instructions: Apply three layer compression as directed. Electronic Signature(s) Signed: 05/19/2021 4:50:54 PM By: Linton Ham MD Signed: 05/21/2021 5:57:05 PM By: Levan Hurst RN, BSN Entered By: Levan Hurst on 05/19/2021 15:20:33 -------------------------------------------------------------------------------- Problem List Details Patient Name:  Date of Service: Abigail Guzman, Abigail Abigail W. 05/19/2021 2:00 PM Medical Record Number: [id number] Patient Account Number: [id number] Date of Birth/Sex: Treating RN: [birth year] (75 y.o. [name] Primary Care Provider: Kelton Pillar Other Clinician: Referring Provider: Treating Provider/Extender: Lesle Chris in Treatment: 0 Active Problems ICD-10 Encounter Code Description Active Date MDM Diagnosis S80.11XD Contusion of right lower leg, subsequent encounter 05/19/2021 No Yes L97.818 Non-pressure chronic ulcer of other part of right lower leg with other specified 05/19/2021 No Yes severity Inactive Problems Resolved Problems Electronic Signature(s) Signed: 05/19/2021 4:50:54 PM By: Linton Ham MD Entered By: Linton Ham on 05/19/2021 15:41:24 --------------------------------------------------------------------------------  Progress Note Details Patient Name: Date of Service: Abigail Guzman, [place] Abigail W. 05/19/2021 2:00 PM Medical Record Number: [id number] Patient Account Number: [id number] Date of Birth/Sex: Treating RN: 08/28/1946 (75 y.o. [name] Primary Care Provider: Kelton Pillar Other Clinician: Referring Provider: Treating Provider/Extender: Lesle Chris in Treatment: 0 Subjective Chief Complaint Information obtained from Patient 05/19/2021; patient comes here for review of a wound on the right lateral lower leg secondary to a car door injury History of Present Illness (HPI) ADMISSION 05/19/2021 This is a fairly healthy 75 year old woman who suffered a traumatic contusion to her right lateral lower leg about 3 weeks ago when her car door hit her leg in her garage. Apparently bled profusely. He saw primary care who referred her to dermatology. She was advised to "clean this aggressively" and was initially applying Neosporin more recently Vaseline. We do not have any of these notes. She was able to give [place] a pictorial  history on her phone. Initially a fairly eschared surface. I am not sure if there is a hematoma underneath this but she has been scrubbing at this to remove all the debris from the surface she did a fairly good job. She also reports weeping edema fluid coming out of the surface of the wound. She does not have a wound history. She is not a diabetic Past medical history includes lumbar radiculopathy, hypertension, bradycardia, gastroesophageal reflux disease, irritable bowel syndrome ABIs in our clinic were 1.02 on the right and 1.04 in the left Patient History Information obtained from Patient. Allergies Sulfa (Sulfonamide Antibiotics) (Reaction: rash) Family History Diabetes - Father,Paternal Grandparents, Heart Disease - Father, Hypertension - Father, Stroke - Mother, No family history of Cancer, Hereditary Spherocytosis, Kidney Disease, Lung Disease, Seizures, Thyroid Problems, Tuberculosis. Social History Former smoker, Marital Status - Divorced, Alcohol Use - Moderate, Drug Use - Prior History - Pot, Caffeine Use - Daily. Medical History Musculoskeletal Patient has history of Osteoarthritis Medical A Surgical History Notes nd Gastrointestinal GERD/IBS Review of Systems (ROS) Eyes Complains or has symptoms of Glasses / Contacts - Reading glasses. Ear/Nose/Mouth/Throat Denies complaints or symptoms of Chronic sinus problems or rhinitis. Respiratory Denies complaints or symptoms of Chronic or frequent coughs, Shortness of Breath. Cardiovascular Denies complaints or symptoms of Chest pain. Endocrine Denies complaints or symptoms of Heat/cold intolerance. Genitourinary Denies complaints or symptoms of Frequent urination. Integumentary (Skin) Complains or has symptoms of Wounds. Psychiatric Denies complaints or symptoms of Claustrophobia, Suicidal. Objective Constitutional Sitting or standing Blood Pressure is within target range for patient.. Pulse regular and within target  range for patient.Marland Kitchen Respirations regular, non-labored and within target range.. Temperature is normal and within the target range for the patient.Marland Kitchen Appears in no distress. Vitals Time Taken: 2:20 PM, Height: 66 in, Source: Stated, Weight: 205 lbs, Source: Stated, BMI: 33.1, Temperature: 98.7 F, Pulse: 66 bpm, Respiratory Rate: 16 breaths/min, Blood Pressure: 137/82 mmHg. Respiratory work of breathing is normal. Cardiovascular Pedal pulses palpable and strong bilaterally.. Pitting edema in the right leg from about the mid calf maximal at the ankle. Not a lot of evidence of chronic venous insufficiency. General Notes: Wound exam; the right lateral lower leg. Wound about the size of a quarter. Under illumination very adherent debris on the surface which I removed with a #5 curette this cleans up quite nicely some weeping edema fluid seen no evidence of surrounding infection Integumentary (Hair, Skin) No primary skin condition is seen. Wound #1 status is Open. Original cause of wound was  Trauma. The date acquired was: 04/28/2021. The wound is located on the Right,Lateral Lower Leg. The wound measures 3.1cm length x 2.7cm width x 0.2cm depth; 6.574cm^2 area and 1.315cm^3 volume. There is Fat Layer (Subcutaneous Tissue) exposed. There is no tunneling or undermining noted. There is a medium amount of serosanguineous drainage noted. The wound margin is distinct with the outline attached to the wound base. There is large (67-100%) red granulation within the wound bed. There is a small (1-33%) amount of necrotic tissue within the wound bed including Adherent Slough. Assessment Active Problems ICD-10 Contusion of right lower leg, subsequent encounter Non-pressure chronic ulcer of other part of right lower leg with other specified severity Procedures Wound #1 Pre-procedure diagnosis of Wound #1 is a Venous Leg Ulcer located on the Right,Lateral Lower Leg .Severity of Tissue Pre Debridement is: Fat  layer exposed. There was a Excisional Skin/Subcutaneous Tissue Debridement with a total area of 8.37 sq cm performed by Ricard Dillon., MD. With the following instrument(s): Curette to remove Viable and Non-Viable tissue/material. Material removed includes Subcutaneous Tissue and Slough and. No specimens were taken. A time out was conducted at 15:17, prior to the start of the procedure. A Minimum amount of bleeding was controlled with Pressure. The procedure was tolerated well with a pain level of 0 throughout and a pain level of 0 following the procedure. Post Debridement Measurements: 3.1cm length x 2.7cm width x 0.2cm depth; 1.315cm^3 volume. Character of Wound/Ulcer Post Debridement requires further debridement. Severity of Tissue Post Debridement is: Fat layer exposed. Post procedure Diagnosis Wound #1: Same as Pre-Procedure Pre-procedure diagnosis of Wound #1 is a Venous Leg Ulcer located on the Right,Lateral Lower Leg . There was a Three Layer Compression Therapy Procedure by Levan Hurst, RN. Post procedure Diagnosis Wound #1: Same as Pre-Procedure Plan Follow-up Appointments: Return Appointment in 1 week. Bathing/ Shower/ Hygiene: May shower with protection but do not get wound dressing(s) wet. - Use cast protector Edema Control - Lymphedema / SCD / Other: Elevate legs to the level of the heart or above for 30 minutes daily and/or when sitting, a frequency of: - throughout the day Avoid standing for long periods of time. Exercise regularly WOUND #1: - Lower Leg Wound Laterality: Right, Lateral Cleanser: Soap and Water 1 x Per Week/ Discharge Instructions: May shower and wash wound with dial antibacterial soap and water prior to dressing change. Peri-Wound Care: Sween Lotion (Moisturizing lotion) 1 x Per Week/ Discharge Instructions: Apply moisturizing lotion as directed Prim Dressing: Hydrofera Blue Classic Foam, 2x2 in 1 x Per Week/ ary Discharge Instructions: Moisten with  saline prior to applying to wound bed Secondary Dressing: Woven Gauze Sponge, Non-Sterile 4x4 in 1 x Per Week/ Discharge Instructions: Apply over primary dressing as directed. Secondary Dressing: ABD Pad, 8x10 1 x Per Week/ Discharge Instructions: Apply over primary dressing as directed. Com pression Wrap: ThreePress (3 layer compression wrap) 1 x Per Week/ Discharge Instructions: Apply three layer compression as directed. 1. Traumatic wound in the setting of some edema in her lower legs. 2. Some degree of chronic venous insufficiency seems likely although the patient is very active on her feet working in her yard etc. 3. After debridement the wound cleaned up quite nicely we used Hydrofera Blue ABDs under 3 layer compression she will leave this on a week 4. This should not have too much trouble healing. There was no evidence of infection or coexistent PAD I spent 35 minutes in review of this patient's past medical  history, face-to-face evaluation and preparation of this record Electronic Signature(s) Signed: 05/19/2021 4:50:54 PM By: Linton Ham MD Entered By: Linton Ham on 05/19/2021 15:52:05 -------------------------------------------------------------------------------- HxROS Details Patient Name: Date of Service: Abigail MILTO Delane Ginger, [place] W. 05/19/2021 2:00 PM Medical Record Number: [id number] Patient Account Number: [id number] Date of Birth/Sex: Treating RN: 1946/07/03 (75 y.o. [name] Primary Care Provider: Kelton Pillar Other Clinician: Referring Provider: Treating Provider/Extender: Lesle Chris in Treatment: 0 Information Obtained From Patient Eyes Complaints and Symptoms: Positive for: Glasses / Contacts - Reading glasses Ear/Nose/Mouth/Throat Complaints and Symptoms: Negative for: Chronic sinus problems or rhinitis Respiratory Complaints and Symptoms: Negative for: Chronic or frequent coughs; Shortness of  Breath Cardiovascular Complaints and Symptoms: Negative for: Chest pain Endocrine Complaints and Symptoms: Negative for: Heat/cold intolerance Genitourinary Complaints and Symptoms: Negative for: Frequent urination Integumentary (Skin) Complaints and Symptoms: Positive for: Wounds Psychiatric Complaints and Symptoms: Negative for: Claustrophobia; Suicidal Hematologic/Lymphatic Gastrointestinal Medical History: Past Medical History Notes: GERD/IBS Immunological Musculoskeletal Medical History: Positive for: Osteoarthritis Oncologic Immunizations Pneumococcal Vaccine: Received Pneumococcal Vaccination: Yes Implantable Devices Yes Family and Social History Cancer: No; Diabetes: Yes - Father,Paternal Grandparents; Heart Disease: Yes - Father; Hereditary Spherocytosis: No; Hypertension: Yes - Father; Kidney Disease: No; Lung Disease: No; Seizures: No; Stroke: Yes - Mother; Thyroid Problems: No; Tuberculosis: No; Former smoker; Marital Status - Divorced; Alcohol Use: Moderate; Drug Use: Prior History - Pot; Caffeine Use: Daily; Financial Concerns: No; Food, Clothing or Shelter Needs: No; Support System Lacking: No; Transportation Concerns: No Electronic Signature(s) Signed: 05/19/2021 4:50:54 PM By: Linton Ham MD Signed: 05/19/2021 5:38:29 PM By: Lorrin Jackson Entered By: Lorrin Jackson on 05/19/2021 14:27:32 -------------------------------------------------------------------------------- SuperBill Details Patient Name: Date of Service: Abigail Guzman, Abigail Abigail W. 05/19/2021 Medical Record Number: [id number] Patient Account Number: [id number] Date of Birth/Sex: Treating RN: 1946-03-02 (75 y.o. [name] Primary Care Provider: Kelton Pillar Other Clinician: Referring Provider: Treating Provider/Extender: Lesle Chris in Treatment: 0 Diagnosis Coding ICD-10 Codes Code Description S80.11XD Contusion of right lower leg, subsequent  encounter L97.818 Non-pressure chronic ulcer of other part of right lower leg with other specified severity Facility Procedures CPT4 Code: [id number] Description: [facility] VISIT-LEV 3 EST PT Modifier: 25 Quantity: 1 CPT4 Code: [id number] Description: 29528 - DEB SUBQ TISSUE 20 SQ CM/< ICD-10 Diagnosis Description S80.11XD Contusion of right lower leg, subsequent encounter L97.818 Non-pressure chronic ulcer of other part of right lower leg with other specified Modifier: severity Quantity: 1 Physician Procedures : CPT4 Code Description Modifier [id number] WC PHYS LEVEL 3 NEW PT 25 ICD-10 Diagnosis Description S80.11XD Contusion of right lower leg, subsequent encounter L97.818 Non-pressure chronic ulcer of other part of right lower leg with other specified severity Quantity: 1 : [id number] - WC PHYS SUBQ TISS 20 SQ CM ICD-10 Diagnosis Description S80.11XD Contusion of right lower leg, subsequent encounter L97.818 Non-pressure chronic ulcer of other part of right lower leg with other specified severity Quantity: 1 Electronic Signature(s) Signed: 05/20/2021 5:45:41 PM By: Linton Ham MD Signed: 05/21/2021 5:57:05 PM By: Levan Hurst RN, BSN Previous Signature: 05/19/2021 4:50:54 PM Version By: Linton Ham MD Entered By: Levan Hurst on 05/19/2021 17:31:02

## 2021-05-26 ENCOUNTER — Encounter (HOSPITAL_BASED_OUTPATIENT_CLINIC_OR_DEPARTMENT_OTHER): Payer: Medicare Other | Admitting: Internal Medicine

## 2021-05-26 ENCOUNTER — Other Ambulatory Visit: Payer: Self-pay

## 2021-05-26 DIAGNOSIS — S8011XA Contusion of right lower leg, initial encounter: Secondary | ICD-10-CM | POA: Diagnosis not present

## 2021-05-26 DIAGNOSIS — L97812 Non-pressure chronic ulcer of other part of right lower leg with fat layer exposed: Secondary | ICD-10-CM | POA: Diagnosis not present

## 2021-05-26 DIAGNOSIS — L97818 Non-pressure chronic ulcer of other part of right lower leg with other specified severity: Secondary | ICD-10-CM | POA: Diagnosis not present

## 2021-05-27 NOTE — Progress Notes (Signed)
Abigail, Guzman ([id number]) Visit Report for 05/26/2021 HPI Details Patient Name: Date of Service: Abigail Guzman, [place] NDRA W. 05/26/2021 12:30 PM Medical Record Number: [id number] Patient Account Number: [id number] Date of Birth/Sex: Treating RN: 23-May-1946 (75 y.o. Elam Dutch Primary Care Provider: Kelton Pillar Other Clinician: Referring Provider: Treating Provider/Extender: Lesle Chris in Treatment: 1 History of Present Illness HPI Description: ADMISSION 05/19/2021 This is a fairly healthy 75 year old woman who suffered a traumatic contusion to her right lateral lower leg about 3 weeks ago when her car door hit her leg in her garage. Apparently bled profusely. He saw primary care who referred her to dermatology. She was advised to "clean this aggressively" and was initially applying Neosporin more recently Vaseline. We do not have any of these notes. She was able to give [place] a pictorial history on her phone. Initially a fairly eschared surface. I am not sure if there is a hematoma underneath this but she has been scrubbing at this to remove all the debris from the surface she did a fairly good job. She also reports weeping edema fluid coming out of the surface of the wound. She does not have a wound history. She is not a diabetic Past medical history includes lumbar radiculopathy, hypertension, bradycardia, gastroesophageal reflux disease, irritable bowel syndrome ABIs in our clinic were 1.02 on the right and 1.04 in the left 6/13; patient suffered a car door contusion of the right lower leg. Completely nonviable surface last time which required debridement. We have been using Hydrofera Blue under compression. Electronic Signature(s) Signed: 05/26/2021 5:24:16 PM By: Linton Ham MD Entered By: Linton Ham on 05/26/2021 13:08:31 -------------------------------------------------------------------------------- Physical Exam Details Patient Name: Date  of Service: HA Abigail Guzman, [name] 05/26/2021 12:30 PM Medical Record Number: [id number] Patient Account Number: [id number] Date of Birth/Sex: Treating RN: 1946-01-23 (75 y.o. Elam Dutch Primary Care Provider: Kelton Pillar Other Clinician: Referring Provider: Treating Provider/Extender: Lesle Chris in Treatment: 1 Constitutional Sitting or standing Blood Pressure is within target range for patient.. Pulse regular and within target range for patient.Marland Kitchen Respirations regular, non-labored and within target range.. Temperature is normal and within the target range for the patient.Marland Kitchen Appears in no distress. Cardiovascular Pedal pulses are palpable on the right. We have excellent edema control under our compression. Notes Wound exam; right lateral lower leg. Wound looks a lot cleaner this week. No debridement no surrounding infection. Her edema control is good Engineer, maintenance) Signed: 05/26/2021 5:24:16 PM By: Linton Ham MD Entered By: Linton Ham on 05/26/2021 13:10:04 -------------------------------------------------------------------------------- Physician Orders Details Patient Name: Date of Service: HA Abigail Guzman, [name] 05/26/2021 12:30 PM Medical Record Number: [id number] Patient Account Number: [id number] Date of Birth/Sex: Treating RN: 1946/02/25 (75 y.o. Elam Dutch Primary Care Provider: Kelton Pillar Other Clinician: Referring Provider: Treating Provider/Extender: Lesle Chris in Treatment: 1 Verbal / Phone Orders: No Diagnosis Coding ICD-10 Coding Code Description S80.11XD Contusion of right lower leg, subsequent encounter L97.818 Non-pressure chronic ulcer of other part of right lower leg with other specified severity Follow-up Appointments Return Appointment in 1 week. Bathing/ Shower/ Hygiene May shower with protection but do not get wound dressing(s) wet. - Use cast protector Edema  Control - Lymphedema / SCD / Other Elevate legs to the level of the heart or above for 30 minutes daily and/or when sitting, a frequency of: - throughout the day Avoid standing for long periods of time. Exercise regularly Wound Treatment  Wound #1 - Lower Leg Wound Laterality: Right, Lateral Cleanser: Soap and Water 1 x Per Week Discharge Instructions: May shower and wash wound with dial antibacterial soap and water prior to dressing change. Peri-Wound Care: Sween Lotion (Moisturizing lotion) 1 x Per Week Discharge Instructions: Apply moisturizing lotion as directed Prim Dressing: Hydrofera Blue Classic Foam, 2x2 in 1 x Per Week ary Discharge Instructions: Moisten with saline prior to applying to wound bed Secondary Dressing: Woven Gauze Sponge, Non-Sterile 4x4 in 1 x Per Week Discharge Instructions: Apply over primary dressing as directed. Secondary Dressing: ABD Pad, 8x10 1 x Per Week Discharge Instructions: Apply over primary dressing as directed. Compression Wrap: ThreePress (3 layer compression wrap) 1 x Per Week Discharge Instructions: Apply three layer compression as directed. Electronic Signature(s) Signed: 05/26/2021 5:24:16 PM By: Linton Ham MD Signed: 05/27/2021 6:10:01 PM By: Baruch Gouty RN, BSN Entered By: Baruch Gouty on 05/26/2021 13:03:55 -------------------------------------------------------------------------------- Problem List Details Patient Name: Date of Service: Abigail Guzman, [name] 05/26/2021 12:30 PM Medical Record Number: [id number] Patient Account Number: [id number] Date of Birth/Sex: Treating RN: [birth year] (75 y.o. Elam Dutch Primary Care Provider: Kelton Pillar Other Clinician: Referring Provider: Treating Provider/Extender: Lesle Chris in Treatment: 1 Active Problems ICD-10 Encounter Code Description Active Date MDM Diagnosis S80.11XD Contusion of right lower leg, subsequent encounter 05/19/2021 No  Yes L97.818 Non-pressure chronic ulcer of other part of right lower leg with other specified 05/19/2021 No Yes severity Inactive Problems Resolved Problems Electronic Signature(s) Signed: 05/26/2021 5:24:16 PM By: Linton Ham MD Entered By: Linton Ham on 05/26/2021 13:07:50 -------------------------------------------------------------------------------- Progress Note Details Patient Name: Date of Service: Abigail Guzman, [name] 05/26/2021 12:30 PM Medical Record Number: [id number] Patient Account Number: [id number] Date of Birth/Sex: Treating RN: 09-09-1946 (76 y.o. Elam Dutch Primary Care Provider: Kelton Pillar Other Clinician: Referring Provider: Treating Provider/Extender: Lesle Chris in Treatment: 1 Subjective History of Present Illness (HPI) ADMISSION 05/19/2021 This is a fairly healthy 75 year old woman who suffered a traumatic contusion to her right lateral lower leg about 3 weeks ago when her car door hit her leg in her garage. Apparently bled profusely. He saw primary care who referred her to dermatology. She was advised to "clean this aggressively" and was initially applying Neosporin more recently Vaseline. We do not have any of these notes. She was able to give [place] a pictorial history on her phone. Initially a fairly eschared surface. I am not sure if there is a hematoma underneath this but she has been scrubbing at this to remove all the debris from the surface she did a fairly good job. She also reports weeping edema fluid coming out of the surface of the wound. She does not have a wound history. She is not a diabetic Past medical history includes lumbar radiculopathy, hypertension, bradycardia, gastroesophageal reflux disease, irritable bowel syndrome ABIs in our clinic were 1.02 on the right and 1.04 in the left 6/13; patient suffered a car door contusion of the right lower leg. Completely nonviable surface last time which required  debridement. We have been using Hydrofera Blue under compression. Objective Constitutional Sitting or standing Blood Pressure is within target range for patient.. Pulse regular and within target range for patient.Marland Kitchen Respirations regular, non-labored and within target range.. Temperature is normal and within the target range for the patient.Marland Kitchen Appears in no distress. Vitals Time Taken: 12:38 PM, Height: 66 in, Weight: 205 lbs, BMI: 33.1, Temperature: 98.3 F, Pulse: 59 bpm, Respiratory  Rate: 16 breaths/min, Blood Pressure: 124/79 mmHg. Cardiovascular Pedal pulses are palpable on the right. We have excellent edema control under our compression. General Notes: Wound exam; right lateral lower leg. Wound looks a lot cleaner this week. No debridement no surrounding infection. Her edema control is good Integumentary (Hair, Skin) Wound #1 status is Open. Original cause of wound was Trauma. The date acquired was: 04/28/2021. The wound has been in treatment 1 weeks. The wound is located on the Right,Lateral Lower Leg. The wound measures 2.5cm length x 2cm width x 0.2cm depth; 3.927cm^2 area and 0.785cm^3 volume. There is Fat Layer (Subcutaneous Tissue) exposed. There is no tunneling or undermining noted. There is a medium amount of serosanguineous drainage noted. The wound margin is distinct with the outline attached to the wound base. There is large (67-100%) red granulation within the wound bed. There is a small (1-33%) amount of necrotic tissue within the wound bed including Adherent Slough. Assessment Active Problems ICD-10 Contusion of right lower leg, subsequent encounter Non-pressure chronic ulcer of other part of right lower leg with other specified severity Procedures Wound #1 Pre-procedure diagnosis of Wound #1 is a Venous Leg Ulcer located on the Right,Lateral Lower Leg . There was a Three Layer Compression Therapy Procedure by Lorrin Jackson, RN. Post procedure Diagnosis Wound #1: Same as  Pre-Procedure Plan Follow-up Appointments: Return Appointment in 1 week. Bathing/ Shower/ Hygiene: May shower with protection but do not get wound dressing(s) wet. - Use cast protector Edema Control - Lymphedema / SCD / Other: Elevate legs to the level of the heart or above for 30 minutes daily and/or when sitting, a frequency of: - throughout the day Avoid standing for long periods of time. Exercise regularly WOUND #1: - Lower Leg Wound Laterality: Right, Lateral Cleanser: Soap and Water 1 x Per Week/ Discharge Instructions: May shower and wash wound with dial antibacterial soap and water prior to dressing change. Peri-Wound Care: Sween Lotion (Moisturizing lotion) 1 x Per Week/ Discharge Instructions: Apply moisturizing lotion as directed Prim Dressing: Hydrofera Blue Classic Foam, 2x2 in 1 x Per Week/ ary Discharge Instructions: Moisten with saline prior to applying to wound bed Secondary Dressing: Woven Gauze Sponge, Non-Sterile 4x4 in 1 x Per Week/ Discharge Instructions: Apply over primary dressing as directed. Secondary Dressing: ABD Pad, 8x10 1 x Per Week/ Discharge Instructions: Apply over primary dressing as directed. Com pression Wrap: ThreePress (3 layer compression wrap) 1 x Per Week/ Discharge Instructions: Apply three layer compression as directed. 1. Continued with Hydrofera Blue under 3 layer compression Electronic Signature(s) Signed: 05/26/2021 5:24:16 PM By: Linton Ham MD Entered By: Linton Ham on 05/26/2021 13:15:25 -------------------------------------------------------------------------------- SuperBill Details Patient Name: Date of Service: Abigail Guzman, [name] 05/26/2021 Medical Record Number: [id number] Patient Account Number: [id number] Date of Birth/Sex: Treating RN: [birth year] (75 y.o. Elam Dutch Primary Care Provider: Kelton Pillar Other Clinician: Referring Provider: Treating Provider/Extender: Lesle Chris in Treatment: 1 Diagnosis Coding ICD-10 Codes Code Description S80.11XD Contusion of right lower leg, subsequent encounter L97.818 Non-pressure chronic ulcer of other part of right lower leg with other specified severity Facility Procedures CPT4 Code: [id number] Description: (Facility Use Only) [id number] - APPLY MULTLAY COMPRS LWR RT LEG Modifier: Quantity: 1 Physician Procedures : CPT4 Code Description Modifier [id number] - WC PHYS LEVEL 3 - EST PT ICD-10 Diagnosis Description S80.11XD Contusion of right lower leg, subsequent encounter L97.818 Non-pressure chronic ulcer of other part of right lower leg with other specified  severity  Quantity: 1 Electronic Signature(s) Signed: 05/26/2021 5:24:16 PM By: Linton Ham MD Entered By: Linton Ham on 05/26/2021 13:15:42

## 2021-05-28 NOTE — Progress Notes (Signed)
DELANE, STALLING ([id number]) Visit Report for 05/26/2021 Arrival Information Details Patient Name: Date of Service: Abigail Guzman Abigail W. 05/26/2021 12:30 PM Medical Record Number: [id number] Patient Account Number: [id number] Date of Birth/Sex: Treating RN: 04-09-1946 (74 y.o. Martyn Malay, Linda Primary Care Jamesmichael Shadd: Kelton Pillar Other Clinician: Referring Leolia Vinzant: Treating Hosea Hanawalt/Extender: Lesle Chris in Treatment: 1 Visit Information History Since Last Visit Added or deleted any medications: No Patient Arrived: Ambulatory Any new allergies or adverse reactions: No Arrival Time: 12:37 Had a fall or experienced change in No Accompanied By: self activities of daily living that may affect Transfer Assistance: None risk of falls: Patient Identification Verified: Yes Signs or symptoms of abuse/neglect since last visito No Secondary Verification Process Completed: Yes Hospitalized since last visit: No Patient Requires Transmission-Based Precautions: No Implantable device outside of the clinic excluding No Patient Has Alerts: No cellular tissue based products placed in the center since last visit: Has Dressing in Place as Prescribed: Yes Pain Present Now: No Electronic Signature(s) Signed: 05/27/2021 8:11:55 AM By: Sandre Kitty Entered By: Sandre Kitty on 05/26/2021 12:38:05 -------------------------------------------------------------------------------- Compression Therapy Details Patient Name: Date of Service: Abigail Guzman, [place]. 05/26/2021 12:30 PM Medical Record Number: [id number] Patient Account Number: [id number] Date of Birth/Sex: Treating RN: September 15, 1946 (75 y.o. Elam Dutch Primary Care Jelani Trueba: Kelton Pillar Other Clinician: Referring Faren Florence: Treating Brahim Dolman/Extender: Lesle Chris in Treatment: 1 Compression Therapy Performed for Wound Assessment: Wound #1 Right,Lateral Lower  Leg Performed By: Clinician Lorrin Jackson, RN Compression Type: Three Layer Post Procedure Diagnosis Same as Pre-procedure Electronic Signature(s) Signed: 05/27/2021 6:10:01 PM By: Baruch Gouty RN, BSN Entered By: Baruch Gouty on 05/26/2021 13:03:17 -------------------------------------------------------------------------------- Encounter Discharge Information Details Patient Name: Date of Service: Abigail Guzman, Abigail Abigail W. 05/26/2021 12:30 PM Medical Record Number: [id number] Patient Account Number: [id number] Date of Birth/Sex: Treating RN: 1946-11-05 (75 y.o. [name], [name] Primary Care Venida Tsukamoto: Kelton Pillar Other Clinician: Referring Makynzi Eastland: Treating Chukwuebuka Churchill/Extender: Lesle Chris in Treatment: 1 Encounter Discharge Information Items Discharge Condition: Stable Ambulatory Status: Ambulatory Discharge Destination: Home Transportation: Private Auto Accompanied By: self Schedule Follow-up Appointment: Yes Clinical Summary of Care: Electronic Signature(s) Signed: 05/26/2021 5:45:16 PM By: Deon Pilling Entered By: Deon Pilling on 05/26/2021 13:20:49 -------------------------------------------------------------------------------- Lower Extremity Assessment Details Patient Name: Date of Service: Abigail Guzman Abigail W. 05/26/2021 12:30 PM Medical Record Number: [id number] Patient Account Number: [id number] Date of Birth/Sex: Treating RN: [birth year] (75 y.o. Elam Dutch Primary Care Harneet Noblett: Kelton Pillar Other Clinician: Referring Rondle Lohse: Treating Eisa Conaway/Extender: Lesle Chris in Treatment: 1 Edema Assessment Assessed: [Left: No] [Right: Yes] Edema: [Left: Ye] [Right: s] Calf Left: Right: Point of Measurement: From Medial Instep 37 cm Ankle Left: Right: Point of Measurement: From Medial Instep 22.5 cm Vascular Assessment Pulses: Dorsalis Pedis Palpable: [Right:Yes] Electronic  Signature(s) Signed: 05/27/2021 8:11:55 AM By: Sandre Kitty Signed: 05/27/2021 6:10:01 PM By: Baruch Gouty RN, BSN Entered By: Sandre Kitty on 05/26/2021 12:44:51 -------------------------------------------------------------------------------- Multi Wound Chart Details Patient Name: Date of Service: Abigail Guzman, Abigail Abigail W. 05/26/2021 12:30 PM Medical Record Number: [id number] Patient Account Number: [id number] Date of Birth/Sex: Treating RN: 1946-01-18 (75 y.o. Elam Dutch Primary Care Neasia Fleeman: Kelton Pillar Other Clinician: Referring Larayah Clute: Treating Beonca Gibb/Extender: Lesle Chris in Treatment: 1 Vital Signs Height(in): 66 Pulse(bpm): 59 Weight(lbs): 205 Blood Pressure(mmHg): 124/79 Body Mass Index(BMI): 33 Temperature(F): 98.3 Respiratory Rate(breaths/min): 16 Photos: [1:No Photos Right, Lateral Lower Leg] [  N/A:N/A N/A] Wound Location: [1:Trauma] [N/A:N/A] Wounding Event: [1:Venous Leg Ulcer] [N/A:N/A] Primary Etiology: [1:Osteoarthritis] [N/A:N/A] Comorbid History: [1:04/28/2021] [N/A:N/A] Date Acquired: [1:1] [N/A:N/A] Weeks of Treatment: [1:Open] [N/A:N/A] Wound Status: [1:2.5x2x0.2] [N/A:N/A] Measurements L x W x D (cm) [1:3.927] [N/A:N/A] A (cm) : rea [1:0.785] [N/A:N/A] Volume (cm) : [1:40.30%] [N/A:N/A] % Reduction in A rea: [1:40.30%] [N/A:N/A] % Reduction in Volume: [1:Full Thickness Without Exposed] [N/A:N/A] Classification: [1:Support Structures Medium] [N/A:N/A] Exudate Amount: [1:Serosanguineous] [N/A:N/A] Exudate Type: [1:red, brown] [N/A:N/A] Exudate Color: [1:Distinct, outline attached] [N/A:N/A] Wound Margin: [1:Large (67-100%)] [N/A:N/A] Granulation Amount: [1:Red] [N/A:N/A] Granulation Quality: [1:Small (1-33%)] [N/A:N/A] Necrotic Amount: [1:Fat Layer (Subcutaneous Tissue): Yes N/A] Exposed Structures: [1:Fascia: No Tendon: No Muscle: No Joint: No Bone: No Small (1-33%)]  [N/A:N/A] Epithelialization: [1:Compression Therapy] [N/A:N/A] Treatment Notes Electronic Signature(s) Signed: 05/26/2021 5:24:16 PM By: Linton Ham MD Signed: 05/27/2021 6:10:01 PM By: Baruch Gouty RN, BSN Entered By: Linton Ham on 05/26/2021 13:08:01 -------------------------------------------------------------------------------- Multi-Disciplinary Care Plan Details Patient Name: Date of Service: Abigail Guzman, Abigail Abigail W. 05/26/2021 12:30 PM Medical Record Number: [id number] Patient Account Number: [id number] Date of Birth/Sex: Treating RN: 05-Aug-1946 (75 y.o. Elam Dutch Primary Care Kezia Benevides: Kelton Pillar Other Clinician: Referring Raelan Burgoon: Treating Gilliam Hawkes/Extender: Lesle Chris in Treatment: 1 Multidisciplinary Care Plan reviewed with physician Active Inactive Venous Leg Ulcer Nursing Diagnoses: Knowledge deficit related to disease process and management Goals: Patient will maintain optimal edema control Date Initiated: 05/19/2021 Target Resolution Date: 06/20/2021 Goal Status: Active Patient/caregiver will verbalize understanding of disease process and disease management Date Initiated: 05/19/2021 Target Resolution Date: 06/20/2021 Goal Status: Active Interventions: Assess peripheral edema status every visit. Compression as ordered Provide education on venous insufficiency Notes: Wound/Skin Impairment Nursing Diagnoses: Impaired tissue integrity Knowledge deficit related to ulceration/compromised skin integrity Goals: Patient/caregiver will verbalize understanding of skin care regimen Date Initiated: 05/19/2021 Target Resolution Date: 06/20/2021 Goal Status: Active Ulcer/skin breakdown will have a volume reduction of 30% by week 4 Date Initiated: 05/19/2021 Target Resolution Date: 06/20/2021 Goal Status: Active Interventions: Assess patient/caregiver ability to obtain necessary supplies Assess patient/caregiver ability to  perform ulcer/skin care regimen upon admission and as needed Assess ulceration(s) every visit Provide education on ulcer and skin care Notes: Electronic Signature(s) Signed: 05/27/2021 6:10:01 PM By: Baruch Gouty RN, BSN Entered By: Baruch Gouty on 05/26/2021 13:02:20 -------------------------------------------------------------------------------- Pain Assessment Details Patient Name: Date of Service: Abigail Guzman, [place] W. 05/26/2021 12:30 PM Medical Record Number: [id number] Patient Account Number: [id number] Date of Birth/Sex: Treating RN: 19-Oct-1946 (75 y.o. Elam Dutch Primary Care Kuron Docken: Kelton Pillar Other Clinician: Referring Steadman Prosperi: Treating Lowen Mansouri/Extender: Lesle Chris in Treatment: 1 Active Problems Location of Pain Severity and Description of Pain Patient Has Paino No Site Locations Pain Management and Medication Current Pain Management: Electronic Signature(s) Signed: 05/27/2021 8:11:55 AM By: Sandre Kitty Signed: 05/27/2021 6:10:01 PM By: Baruch Gouty RN, BSN Entered By: Sandre Kitty on 05/26/2021 12:38:31 -------------------------------------------------------------------------------- Patient/Caregiver Education Details Patient Name: Date of Service: Abigail Kelli Churn Abigail W. 6/13/2022andnbsp12:30 PM Medical Record Number: [id number] Patient Account Number: [id number] Date of Birth/Gender: Treating RN: October 07, 1946 (75 y.o. Elam Dutch Primary Care Physician: Kelton Pillar Other Clinician: Referring Physician: Treating Physician/Extender: Lesle Chris in Treatment: 1 Education Assessment Education Provided To: Patient Education Topics Provided Venous: Methods: Explain/Verbal Responses: Reinforcements needed, State content correctly Wound/Skin Impairment: Methods: Explain/Verbal Responses: Reinforcements needed, State content correctly Electronic  Signature(s) Signed: 05/27/2021 6:10:01 PM By: Baruch Gouty RN, BSN Entered By: Baruch Gouty  on 05/26/2021 13:02:51 -------------------------------------------------------------------------------- Wound Assessment Details Patient Name: Date of Service: Abigail Guzman Abigail W. 05/26/2021 12:30 PM Medical Record Number: [id number] Patient Account Number: [id number] Date of Birth/Sex: Treating RN: [birth year] (75 y.o. Martyn Malay, Linda Primary Care Charnese Federici: Kelton Pillar Other Clinician: Referring Davine Coba: Treating Artemus Romanoff/Extender: Lesle Chris in Treatment: 1 Wound Status Wound Number: 1 Primary Etiology: Venous Leg Ulcer Wound Location: Right, Lateral Lower Leg Wound Status: Open Wounding Event: Trauma Comorbid History: Osteoarthritis Date Acquired: 04/28/2021 Weeks Of Treatment: 1 Clustered Wound: No Photos Wound Measurements Length: (cm) 2.5 Width: (cm) 2 Depth: (cm) 0.2 Area: (cm) 3.927 Volume: (cm) 0.785 % Reduction in Area: 40.3% % Reduction in Volume: 40.3% Epithelialization: Small (1-33%) Tunneling: No Undermining: No Wound Description Classification: Full Thickness Without Exposed Support Structures Wound Margin: Distinct, outline attached Exudate Amount: Medium Exudate Type: Serosanguineous Exudate Color: red, brown Foul Odor After Cleansing: No Slough/Fibrino Yes Wound Bed Granulation Amount: Large (67-100%) Exposed Structure Granulation Quality: Red Fascia Exposed: No Necrotic Amount: Small (1-33%) Fat Layer (Subcutaneous Tissue) Exposed: Yes Necrotic Quality: Adherent Slough Tendon Exposed: No Muscle Exposed: No Joint Exposed: No Bone Exposed: No Treatment Notes Wound #1 (Lower Leg) Wound Laterality: Right, Lateral Cleanser Soap and Water Discharge Instruction: May shower and wash wound with dial antibacterial soap and water prior to dressing change. Peri-Wound Care Sween Lotion (Moisturizing  lotion) Discharge Instruction: Apply moisturizing lotion as directed Topical Primary Dressing Hydrofera Blue Classic Foam, 2x2 in Discharge Instruction: Moisten with saline prior to applying to wound bed Secondary Dressing Woven Gauze Sponge, Non-Sterile 4x4 in Discharge Instruction: Apply over primary dressing as directed. ABD Pad, 8x10 Discharge Instruction: Apply over primary dressing as directed. Secured With Compression Wrap ThreePress (3 layer compression wrap) Discharge Instruction: Apply three layer compression as directed. Compression Stockings Add-Ons Electronic Signature(s) Signed: 05/27/2021 6:10:01 PM By: Baruch Gouty RN, BSN Signed: 05/28/2021 1:43:18 PM By: Sandre Kitty Previous Signature: 05/27/2021 8:11:55 AM Version By: Sandre Kitty Entered By: Sandre Kitty on 05/27/2021 12:30:53 -------------------------------------------------------------------------------- Vitals Details Patient Name: Date of Service: Abigail Guzman, Abigail Abigail W. 05/26/2021 12:30 PM Medical Record Number: [id number] Patient Account Number: [id number] Date of Birth/Sex: Treating RN: September 24, 1946 (75 y.o. Martyn Malay, Linda Primary Care Odessia Asleson: Kelton Pillar Other Clinician: Referring Charbel Los: Treating Ellenie Salome/Extender: Lesle Chris in Treatment: 1 Vital Signs Time Taken: 12:38 Temperature (F): 98.3 Height (in): 66 Pulse (bpm): 59 Weight (lbs): 205 Respiratory Rate (breaths/min): 16 Body Mass Index (BMI): 33.1 Blood Pressure (mmHg): 124/79 Reference Range: 80 - 120 mg / dl Electronic Signature(s) Signed: 05/27/2021 8:11:55 AM By: Sandre Kitty Entered By: Sandre Kitty on 05/26/2021 12:38:23

## 2021-06-02 ENCOUNTER — Encounter (HOSPITAL_BASED_OUTPATIENT_CLINIC_OR_DEPARTMENT_OTHER): Payer: Medicare Other | Admitting: Internal Medicine

## 2021-06-02 ENCOUNTER — Other Ambulatory Visit: Payer: Self-pay

## 2021-06-02 DIAGNOSIS — I872 Venous insufficiency (chronic) (peripheral): Secondary | ICD-10-CM | POA: Diagnosis not present

## 2021-06-02 DIAGNOSIS — L97812 Non-pressure chronic ulcer of other part of right lower leg with fat layer exposed: Secondary | ICD-10-CM | POA: Diagnosis not present

## 2021-06-02 DIAGNOSIS — S8011XA Contusion of right lower leg, initial encounter: Secondary | ICD-10-CM | POA: Diagnosis not present

## 2021-06-02 DIAGNOSIS — L97818 Non-pressure chronic ulcer of other part of right lower leg with other specified severity: Secondary | ICD-10-CM | POA: Diagnosis not present

## 2021-06-02 NOTE — Progress Notes (Signed)
Abigail, SAUSEDO ([id number]) Visit Report for 06/02/2021 HPI Details Patient Name: Date of Service: Abigail Guzman, [place] NDRA W. 06/02/2021 11:15 A M Medical Record Number: [id number] Patient Account Number: [id number] Date of Birth/Sex: Treating RN: Oct 31, 1946 (75 y.o. Abigail Guzman Primary Care Provider: Kelton Pillar Other Clinician: Referring Provider: Treating Provider/Extender: Lesle Chris in Treatment: 2 History of Present Illness HPI Description: ADMISSION 05/19/2021 This is a fairly healthy 75 year old woman who suffered a traumatic contusion to her right lateral lower leg about 3 weeks ago when her car door hit her leg in her garage. Apparently bled profusely. He saw primary care who referred her to dermatology. She was advised to "clean this aggressively" and was initially applying Neosporin more recently Vaseline. We do not have any of these notes. She was able to give [place] a pictorial history on her phone. Initially a fairly eschared surface. I am not sure if there is a hematoma underneath this but she has been scrubbing at this to remove all the debris from the surface she did a fairly good job. She also reports weeping edema fluid coming out of the surface of the wound. She does not have a wound history. She is not a diabetic Past medical history includes lumbar radiculopathy, hypertension, bradycardia, gastroesophageal reflux disease, irritable bowel syndrome ABIs in our clinic were 1.02 on the right and 1.04 in the left 6/13; patient suffered a car door contusion of the right lower leg. Completely nonviable surface last time which required debridement. We have been using Hydrofera Blue under compression. 6/20; better looking wound surface still using Hydrofera Blue under compression dimensions are smaller Electronic Signature(s) Signed: 06/02/2021 5:10:27 PM By: Linton Ham MD Entered By: Linton Ham on 06/02/2021  12:16:07 -------------------------------------------------------------------------------- Physical Exam Details Patient Name: Date of Service: HA Abigail Guzman, [name] 06/02/2021 11:15 A M Medical Record Number: [id number] Patient Account Number: [id number] Date of Birth/Sex: Treating RN: Feb 19, 1946 (75 y.o. Abigail Guzman Primary Care Provider: Kelton Pillar Other Clinician: Referring Provider: Treating Provider/Extender: Lesle Chris in Treatment: 2 Constitutional Sitting or standing Blood Pressure is within target range for patient.. Pulse regular and within target range for patient.Marland Kitchen Respirations regular, non-labored and within target range.. Temperature is normal and within the target range for the patient.Marland Kitchen Appears in no distress. Cardiovascular Pedal pulses are palpable on the right. We have good edema control on the right. Notes Wound exam; right lateral lower leg. Wound looks clean healthy granulation. Flaking skin around the circumference which was removed. No debridement is necessary. Her edema control is good no evidence of surrounding infection Electronic Signature(s) Signed: 06/02/2021 5:10:27 PM By: Linton Ham MD Entered By: Linton Ham on 06/02/2021 12:19:43 -------------------------------------------------------------------------------- Physician Orders Details Patient Name: Date of Service: HA Abigail Guzman, [name] 06/02/2021 11:15 A M Medical Record Number: [id number] Patient Account Number: [id number] Date of Birth/Sex: Treating RN: [birth year] (75 y.o. [name] Primary Care Provider: Kelton Pillar Other Clinician: Referring Provider: Treating Provider/Extender: Lesle Chris in Treatment: 2 Verbal / Phone Orders: No Diagnosis Coding Follow-up Appointments Return Appointment in 1 week. Bathing/ Shower/ Hygiene May shower with protection but do not get wound dressing(s) wet. - Use cast  protector Edema Control - Lymphedema / SCD / Other Elevate legs to the level of the heart or above for 30 minutes daily and/or when sitting, a frequency of: - throughout the day Avoid standing for long periods of time. Exercise regularly Wound Treatment  Wound #1 - Lower Leg Wound Laterality: Right, Lateral Cleanser: Soap and Water 1 x Per Week Discharge Instructions: May shower and wash wound with dial antibacterial soap and water prior to dressing change. Peri-Wound Care: Sween Lotion (Moisturizing lotion) 1 x Per Week Discharge Instructions: Apply moisturizing lotion as directed Prim Dressing: Hydrofera Blue Classic Foam, 2x2 in 1 x Per Week ary Discharge Instructions: Moisten with saline prior to applying to wound bed Secondary Dressing: Woven Gauze Sponge, Non-Sterile 4x4 in 1 x Per Week Discharge Instructions: Apply over primary dressing as directed. Secondary Dressing: ABD Pad, 8x10 1 x Per Week Discharge Instructions: Apply over primary dressing as directed. Compression Wrap: ThreePress (3 layer compression wrap) 1 x Per Week Discharge Instructions: Apply three layer compression as directed. Electronic Signature(s) Signed: 06/02/2021 5:10:27 PM By: Linton Ham MD Signed: 06/02/2021 6:31:05 PM By: Lorrin Jackson Entered By: Lorrin Jackson on 06/02/2021 12:03:59 -------------------------------------------------------------------------------- Problem List Details Patient Name: Date of Service: HA Abigail Guzman, [name] 06/02/2021 11:15 A M Medical Record Number: [id number] Patient Account Number: [id number] Date of Birth/Sex: Treating RN: 1946-10-09 (75 y.o. Abigail Guzman Primary Care Provider: Kelton Pillar Other Clinician: Referring Provider: Treating Provider/Extender: Lesle Chris in Treatment: 2 Active Problems ICD-10 Encounter Code Description Active Date MDM Diagnosis S80.11XD Contusion of right lower leg, subsequent encounter  05/19/2021 No Yes L97.818 Non-pressure chronic ulcer of other part of right lower leg with other specified 05/19/2021 No Yes severity Inactive Problems Resolved Problems Electronic Signature(s) Signed: 06/02/2021 5:10:27 PM By: Linton Ham MD Entered By: Linton Ham on 06/02/2021 12:15:26 -------------------------------------------------------------------------------- Progress Note Details Patient Name: Date of Service: HA Abigail Guzman, [name] 06/02/2021 11:15 A M Medical Record Number: [id number] Patient Account Number: [id number] Date of Birth/Sex: Treating RN: 1946/04/29 (75 y.o. Abigail Guzman Primary Care Provider: Kelton Pillar Other Clinician: Referring Provider: Treating Provider/Extender: Lesle Chris in Treatment: 2 Subjective History of Present Illness (HPI) ADMISSION 05/19/2021 This is a fairly healthy 75 year old woman who suffered a traumatic contusion to her right lateral lower leg about 3 weeks ago when her car door hit her leg in her garage. Apparently bled profusely. He saw primary care who referred her to dermatology. She was advised to "clean this aggressively" and was initially applying Neosporin more recently Vaseline. We do not have any of these notes. She was able to give [place] a pictorial history on her phone. Initially a fairly eschared surface. I am not sure if there is a hematoma underneath this but she has been scrubbing at this to remove all the debris from the surface she did a fairly good job. She also reports weeping edema fluid coming out of the surface of the wound. She does not have a wound history. She is not a diabetic Past medical history includes lumbar radiculopathy, hypertension, bradycardia, gastroesophageal reflux disease, irritable bowel syndrome ABIs in our clinic were 1.02 on the right and 1.04 in the left 6/13; patient suffered a car door contusion of the right lower leg. Completely nonviable surface last time  which required debridement. We have been using Hydrofera Blue under compression. 6/20; better looking wound surface still using Hydrofera Blue under compression dimensions are smaller Objective Constitutional Sitting or standing Blood Pressure is within target range for patient.. Pulse regular and within target range for patient.Marland Kitchen Respirations regular, non-labored and within target range.. Temperature is normal and within the target range for the patient.Marland Kitchen Appears in no distress. Vitals Time Taken: 11:45 AM, Height:  66 in, Weight: 205 lbs, BMI: 33.1, Temperature: 97.5 F, Pulse: 62 bpm, Respiratory Rate: 16 breaths/min, Blood Pressure: 122/74 mmHg. Cardiovascular Pedal pulses are palpable on the right. We have good edema control on the right. General Notes: Wound exam; right lateral lower leg. Wound looks clean healthy granulation. Flaking skin around the circumference which was removed. No debridement is necessary. Her edema control is good no evidence of surrounding infection Integumentary (Hair, Skin) Wound #1 status is Open. Original cause of wound was Trauma. The date acquired was: 04/28/2021. The wound has been in treatment 2 weeks. The wound is located on the Right,Lateral Lower Leg. The wound measures 2cm length x 1.7cm width x 0.2cm depth; 2.67cm^2 area and 0.534cm^3 volume. There is Fat Layer (Subcutaneous Tissue) exposed. There is no tunneling or undermining noted. There is a medium amount of serosanguineous drainage noted. The wound margin is distinct with the outline attached to the wound base. There is large (67-100%) red, friable granulation within the wound bed. There is no necrotic tissue within the wound bed. Assessment Active Problems ICD-10 Contusion of right lower leg, subsequent encounter Non-pressure chronic ulcer of other part of right lower leg with other specified severity Procedures Wound #1 Pre-procedure diagnosis of Wound #1 is a Venous Leg Ulcer located on the  Right,Lateral Lower Leg . There was a Three Layer Compression Therapy Procedure by Lorrin Jackson, RN. Post procedure Diagnosis Wound #1: Same as Pre-Procedure Plan Follow-up Appointments: Return Appointment in 1 week. Bathing/ Shower/ Hygiene: May shower with protection but do not get wound dressing(s) wet. - Use cast protector Edema Control - Lymphedema / SCD / Other: Elevate legs to the level of the heart or above for 30 minutes daily and/or when sitting, a frequency of: - throughout the day Avoid standing for long periods of time. Exercise regularly WOUND #1: - Lower Leg Wound Laterality: Right, Lateral Cleanser: Soap and Water 1 x Per Week/ Discharge Instructions: May shower and wash wound with dial antibacterial soap and water prior to dressing change. Peri-Wound Care: Sween Lotion (Moisturizing lotion) 1 x Per Week/ Discharge Instructions: Apply moisturizing lotion as directed Prim Dressing: Hydrofera Blue Classic Foam, 2x2 in 1 x Per Week/ ary Discharge Instructions: Moisten with saline prior to applying to wound bed Secondary Dressing: Woven Gauze Sponge, Non-Sterile 4x4 in 1 x Per Week/ Discharge Instructions: Apply over primary dressing as directed. Secondary Dressing: ABD Pad, 8x10 1 x Per Week/ Discharge Instructions: Apply over primary dressing as directed. Com pression Wrap: ThreePress (3 layer compression wrap) 1 x Per Week/ Discharge Instructions: Apply three layer compression as directed. 1. Continue with Hydrofera Blue/ABD under 3 layer compression 2. I see no complicating factors here. The patient wound was initially traumatic, no evidence of infection or edema control is adequate. Pedal pulses are palpable. ABIs were good bilaterally Electronic Signature(s) Signed: 06/02/2021 5:10:27 PM By: Linton Ham MD Entered By: Linton Ham on 06/02/2021 12:20:41 -------------------------------------------------------------------------------- SuperBill  Details Patient Name: Date of Service: Abigail Guzman, [name] 06/02/2021 Medical Record Number: [id number] Patient Account Number: [id number] Date of Birth/Sex: Treating RN: October 26, 1946 (75 y.o. [name] Primary Care Provider: Kelton Pillar Other Clinician: Referring Provider: Treating Provider/Extender: Lesle Chris in Treatment: 2 Diagnosis Coding ICD-10 Codes Code Description S80.11XD Contusion of right lower leg, subsequent encounter L97.818 Non-pressure chronic ulcer of other part of right lower leg with other specified severity Facility Procedures CPT4 Code: [id number] Description: (Facility Use Only) [phone] - APPLY MULTLAY COMPRS LWR RT  LEG ICD-10 Diagnosis Description L97.818 Non-pressure chronic ulcer of other part of right lower leg with other specified sever Modifier: ity Quantity: 1 Physician Procedures : CPT4 Code Description Modifier [id number] - WC PHYS LEVEL 3 - EST PT ICD-10 Diagnosis Description S80.11XD Contusion of right lower leg, subsequent encounter L97.818 Non-pressure chronic ulcer of other part of right lower leg with other specified  severity Quantity: 1 Electronic Signature(s) Signed: 06/02/2021 5:10:27 PM By: Linton Ham MD Entered By: Linton Ham on 06/02/2021 12:20:59

## 2021-06-05 NOTE — Progress Notes (Signed)
Abigail, Guzman ([id number]) Visit Report for 06/02/2021 Arrival Information Details Patient Name: Date of Service: Abigail Guzman, [place] NDRA W. 06/02/2021 11:15 A M Medical Record Number: [id number] Patient Account Number: [id number] Date of Birth/Sex: Treating RN: 1946-03-24 (75 y.o. [name] Primary Care Eevie Lapp: Kelton Pillar Other Clinician: Referring Brieann Osinski: Treating Timur Nibert/Extender: Lesle Chris in Treatment: 2 Visit Information History Since Last Visit Added or deleted any medications: No Patient Arrived: Ambulatory Any new allergies or adverse reactions: No Arrival Time: 11:42 Had a fall or experienced change in No Transfer Assistance: None activities of daily living that may affect Patient Identification Verified: Yes risk of falls: Secondary Verification Process Completed: Yes Signs or symptoms of abuse/neglect since last visito No Patient Requires Transmission-Based Precautions: No Hospitalized since last visit: No Patient Has Alerts: No Implantable device outside of the clinic excluding No cellular tissue based products placed in the center since last visit: Has Dressing in Place as Prescribed: Yes Has Compression in Place as Prescribed: Yes Pain Present Now: No Electronic Signature(s) Signed: 06/02/2021 6:31:05 PM By: Lorrin Jackson Entered By: Lorrin Jackson on 06/02/2021 11:45:10 -------------------------------------------------------------------------------- Compression Therapy Details Patient Name: Date of Service: HA Abigail Guzman, [name] 06/02/2021 11:15 A M Medical Record Number: [id number] Patient Account Number: [id number] Date of Birth/Sex: Treating RN: [birth year] (75 y.o. [name] Primary Care Kyaira Trantham: Kelton Pillar Other Clinician: Referring Janet Decesare: Treating Salisha Bardsley/Extender: Lesle Chris in Treatment: 2 Compression Therapy Performed for Wound Assessment: Wound #1  Right,Lateral Lower Leg Performed By: Clinician Lorrin Jackson, RN Compression Type: Three Layer Post Procedure Diagnosis Same as Pre-procedure Electronic Signature(s) Signed: 06/02/2021 6:31:05 PM By: Lorrin Jackson Entered By: Lorrin Jackson on 06/02/2021 12:04:16 -------------------------------------------------------------------------------- Lower Extremity Assessment Details Patient Name: Date of Service: Abigail Guzman, [name] 06/02/2021 11:15 A M Medical Record Number: [id number] Patient Account Number: [id number] Date of Birth/Sex: Treating RN: 1946-10-14 (75 y.o. [name] Primary Care Marwah Disbro: Kelton Pillar Other Clinician: Referring Retta Pitcher: Treating Jessey Stehlin/Extender: Lesle Chris in Treatment: 2 Edema Assessment Assessed: Shirlyn Goltz: No] [Right: Yes] Edema: [Left: Ye] [Right: s] Calf Left: Right: Point of Measurement: From Medial Instep 35.8 cm Ankle Left: Right: Point of Measurement: From Medial Instep 21.5 cm Vascular Assessment Pulses: Dorsalis Pedis Palpable: [Right:Yes] Electronic Signature(s) Signed: 06/02/2021 6:31:05 PM By: Lorrin Jackson Entered By: Lorrin Jackson on 06/02/2021 11:48:53 -------------------------------------------------------------------------------- Multi Wound Chart Details Patient Name: Date of Service: HA Abigail Guzman, [name] 06/02/2021 11:15 A M Medical Record Number: [id number] Patient Account Number: [id number] Date of Birth/Sex: Treating RN: 1946-07-16 (75 y.o. Benjamine Sprague, Briant Cedar Primary Care Arnika Larzelere: Kelton Pillar Other Clinician: Referring Ommie Degeorge: Treating Abigail Guzman/Extender: Lesle Chris in Treatment: 2 Vital Signs Height(in): 11 Pulse(bpm): 67 Weight(lbs): 205 Blood Pressure(mmHg): 122/74 Body Mass Index(BMI): 33 Temperature(F): 97.5 Respiratory Rate(breaths/min): 16 Photos: [1:No Photos Right, Lateral Lower Leg] [N/A:N/A N/A] Wound Location:  [1:Trauma] [N/A:N/A] Wounding Event: [1:Venous Leg Ulcer] [N/A:N/A] Primary Etiology: [1:Osteoarthritis] [N/A:N/A] Comorbid History: [1:04/28/2021] [N/A:N/A] Date Acquired: [1:2] [N/A:N/A] Weeks of Treatment: [1:Open] [N/A:N/A] Wound Status: [1:2x1.7x0.2] [N/A:N/A] Measurements L x W x D (cm) [1:2.67] [N/A:N/A] A (cm) : rea [1:0.534] [N/A:N/A] Volume (cm) : [1:59.40%] [N/A:N/A] % Reduction in Area: [1:59.40%] [N/A:N/A] % Reduction in Volume: [1:Full Thickness Without Exposed] [N/A:N/A] Classification: [1:Support Structures Medium] [N/A:N/A] Exudate Amount: [1:Serosanguineous] [N/A:N/A] Exudate Type: [1:red, brown] [N/A:N/A] Exudate Color: [1:Distinct, outline attached] [N/A:N/A] Wound Margin: [1:Large (67-100%)] [N/A:N/A] Granulation Amount: [1:Red, Friable] [N/A:N/A] Granulation Quality: [1:None Present (0%)] [  N/A:N/A] Necrotic Amount: [1:Fat Layer (Subcutaneous Tissue): Yes N/A] Exposed Structures: [1:Fascia: No Tendon: No Muscle: No Joint: No Bone: No Medium (34-66%)] [N/A:N/A] Epithelialization: [1:Compression Therapy] [N/A:N/A] Treatment Notes Electronic Signature(s) Signed: 06/02/2021 5:10:27 PM By: Linton Ham MD Signed: 06/02/2021 5:37:12 PM By: Levan Hurst RN, BSN Entered By: Linton Ham on 06/02/2021 12:15:41 -------------------------------------------------------------------------------- Multi-Disciplinary Care Plan Details Patient Name: Date of Service: Abigail Guzman, [name] 06/02/2021 11:15 A M Medical Record Number: [id number] Patient Account Number: [id number] Date of Birth/Sex: Treating RN: [birth year] (75 y.o. [name] Primary Care Terry Bolotin: Kelton Pillar Other Clinician: Referring Willem Klingensmith: Treating Keenon Leitzel/Extender: Lesle Chris in Treatment: 2 Multidisciplinary Care Plan reviewed with physician Active Inactive Venous Leg Ulcer Nursing Diagnoses: Knowledge deficit related to disease process and  management Goals: Patient will maintain optimal edema control Date Initiated: 05/19/2021 Target Resolution Date: 06/20/2021 Goal Status: Active Patient/caregiver will verbalize understanding of disease process and disease management Date Initiated: 05/19/2021 Target Resolution Date: 06/20/2021 Goal Status: Active Interventions: Assess peripheral edema status every visit. Compression as ordered Provide education on venous insufficiency Notes: Wound/Skin Impairment Nursing Diagnoses: Impaired tissue integrity Knowledge deficit related to ulceration/compromised skin integrity Goals: Patient/caregiver will verbalize understanding of skin care regimen Date Initiated: 05/19/2021 Target Resolution Date: 06/20/2021 Goal Status: Active Ulcer/skin breakdown will have a volume reduction of 30% by week 4 Date Initiated: 05/19/2021 Target Resolution Date: 06/20/2021 Goal Status: Active Interventions: Assess patient/caregiver ability to obtain necessary supplies Assess patient/caregiver ability to perform ulcer/skin care regimen upon admission and as needed Assess ulceration(s) every visit Provide education on ulcer and skin care Notes: Electronic Signature(s) Signed: 06/02/2021 6:31:05 PM By: Lorrin Jackson Entered By: Lorrin Jackson on 06/02/2021 12:02:15 -------------------------------------------------------------------------------- Pain Assessment Details Patient Name: Date of Service: HA Abigail Guzman, [name] 06/02/2021 11:15 A M Medical Record Number: [id number] Patient Account Number: [id number] Date of Birth/Sex: Treating RN: March 21, 1946 (75 y.o. [name] Primary Care Zaylah Blecha: Kelton Pillar Other Clinician: Referring Naquan Garman: Treating Anae Hams/Extender: Lesle Chris in Treatment: 2 Active Problems Location of Pain Severity and Description of Pain Patient Has Paino No Site Locations Pain Management and Medication Current Pain Management: Electronic  Signature(s) Signed: 06/02/2021 6:31:05 PM By: Lorrin Jackson Entered By: Lorrin Jackson on 06/02/2021 11:48:40 -------------------------------------------------------------------------------- Patient/Caregiver Education Details Patient Name: Date of Service: HA Abigail Guzman, [name] 6/20/2022andnbsp11:15 A M Medical Record Number: [id number] Patient Account Number: [id number] Date of Birth/Gender: Treating RN: 20-Mar-1946 (75 y.o. [name] Primary Care Physician: Kelton Pillar Other Clinician: Referring Physician: Treating Physician/Extender: Lesle Chris in Treatment: 2 Education Assessment Education Provided To: Patient Education Topics Provided Venous: Methods: Explain/Verbal, Printed Responses: State content correctly Wound/Skin Impairment: Methods: Explain/Verbal, Printed Responses: State content correctly Electronic Signature(s) Signed: 06/02/2021 6:31:05 PM By: Lorrin Jackson Entered By: Lorrin Jackson on 06/02/2021 12:03:31 -------------------------------------------------------------------------------- Wound Assessment Details Patient Name: Date of Service: HA Abigail Guzman, [name] 06/02/2021 11:15 A M Medical Record Number: [id number] Patient Account Number: [id number] Date of Birth/Sex: Treating RN: 07-27-1946 (75 y.o. [name] Primary Care Rhylin Venters: Kelton Pillar Other Clinician: Referring Quinton Voth: Treating Monti Jilek/Extender: Lesle Chris in Treatment: 2 Wound Status Wound Number: 1 Primary Etiology: Venous Leg Ulcer Wound Location: Right, Lateral Lower Leg Wound Status: Open Wounding Event: Trauma Comorbid History: Osteoarthritis Date Acquired: 04/28/2021 Weeks Of Treatment: 2 Clustered Wound: No Photos Wound Measurements Length: (cm) 2 Width: (cm) 1.7 Depth: (cm) 0.2 Area: (cm) 2.67 Volume: (cm) 0.534 % Reduction  in Area: 59.4% % Reduction in Volume: 59.4% Epithelialization:  Medium (34-66%) Tunneling: No Undermining: No Wound Description Classification: Full Thickness Without Exposed Support Structures Wound Margin: Distinct, outline attached Exudate Amount: Medium Exudate Type: Serosanguineous Exudate Color: red, brown Foul Odor After Cleansing: No Slough/Fibrino No Wound Bed Granulation Amount: Large (67-100%) Exposed Structure Granulation Quality: Red, Friable Fascia Exposed: No Necrotic Amount: None Present (0%) Fat Layer (Subcutaneous Tissue) Exposed: Yes Tendon Exposed: No Muscle Exposed: No Joint Exposed: No Bone Exposed: No Electronic Signature(s) Signed: 06/02/2021 6:31:05 PM By: Lorrin Jackson Signed: 06/05/2021 8:38:46 AM By: Leane Call Entered By: Leane Call on 06/02/2021 15:28:37 -------------------------------------------------------------------------------- Vitals Details Patient Name: Date of Service: HA Abigail Guzman, [name] 06/02/2021 11:15 A M Medical Record Number: [id number] Patient Account Number: [id number] Date of Birth/Sex: Treating RN: Aug 17, 1946 (75 y.o. [name] Primary Care Sayra Frisby: Kelton Pillar Other Clinician: Referring Toi Stelly: Treating Alichia Alridge/Extender: Lesle Chris in Treatment: 2 Vital Signs Time Taken: 11:45 Temperature (F): 97.5 Height (in): 66 Pulse (bpm): 62 Weight (lbs): 205 Respiratory Rate (breaths/min): 16 Body Mass Index (BMI): 33.1 Blood Pressure (mmHg): 122/74 Reference Range: 80 - 120 mg / dl Electronic Signature(s) Signed: 06/02/2021 6:31:05 PM By: Lorrin Jackson Entered By: Lorrin Jackson on 06/02/2021 11:45:53

## 2021-06-09 ENCOUNTER — Encounter (HOSPITAL_BASED_OUTPATIENT_CLINIC_OR_DEPARTMENT_OTHER): Payer: Medicare Other | Admitting: Internal Medicine

## 2021-06-10 ENCOUNTER — Encounter (HOSPITAL_BASED_OUTPATIENT_CLINIC_OR_DEPARTMENT_OTHER): Payer: Medicare Other | Admitting: Internal Medicine

## 2021-06-10 ENCOUNTER — Other Ambulatory Visit: Payer: Self-pay

## 2021-06-10 DIAGNOSIS — S8011XA Contusion of right lower leg, initial encounter: Secondary | ICD-10-CM | POA: Diagnosis not present

## 2021-06-10 DIAGNOSIS — L97818 Non-pressure chronic ulcer of other part of right lower leg with other specified severity: Secondary | ICD-10-CM | POA: Diagnosis not present

## 2021-06-10 DIAGNOSIS — L97812 Non-pressure chronic ulcer of other part of right lower leg with fat layer exposed: Secondary | ICD-10-CM | POA: Diagnosis not present

## 2021-06-10 DIAGNOSIS — I872 Venous insufficiency (chronic) (peripheral): Secondary | ICD-10-CM | POA: Diagnosis not present

## 2021-06-10 NOTE — Progress Notes (Addendum)
JAYLAA, GALLION ([id number]) Visit Report for 06/10/2021 HPI Details Patient Name: Date of Service: Jillene Bucks, [place] NDRA W. 06/10/2021 9:00 A M Medical Record Number: [id number] Patient Account Number: [id number] Date of Birth/Sex: Treating RN: Sep 29, 1946 (75 y.o. Tonita Phoenix, Lauren Primary Care Provider: Kelton Pillar Other Clinician: Referring Provider: Treating Provider/Extender: Lesle Chris in Treatment: 3 History of Present Illness HPI Description: ADMISSION 05/19/2021 This is a fairly healthy 75 year old woman who suffered a traumatic contusion to her right lateral lower leg about 3 weeks ago when her car door hit her leg in her garage. Apparently bled profusely. He saw primary care who referred her to dermatology. She was advised to "clean this aggressively" and was initially applying Neosporin more recently Vaseline. We do not have any of these notes. She was able to give [place] a pictorial history on her phone. Initially a fairly eschared surface. I am not sure if there is a hematoma underneath this but she has been scrubbing at this to remove all the debris from the surface she did a fairly good job. She also reports weeping edema fluid coming out of the surface of the wound. She does not have a wound history. She is not a diabetic Past medical history includes lumbar radiculopathy, hypertension, bradycardia, gastroesophageal reflux disease, irritable bowel syndrome ABIs in our clinic were 1.02 on the right and 1.04 in the left 6/13; patient suffered a car door contusion of the right lower leg. Completely nonviable surface last time which required debridement. We have been using Hydrofera Blue under compression. 6/20; better looking wound surface still using Hydrofera Blue under compression dimensions are smaller 6/28; slightly smaller no depth wound looks healthy. Using Hydrofera Blue under compression Electronic Signature(s) Signed: 06/10/2021 5:29:17 PM  By: Linton Ham MD Entered By: Linton Ham on 06/10/2021 10:36:22 -------------------------------------------------------------------------------- Physical Exam Details Patient Name: Date of Service: Jillene Bucks, [name] 06/10/2021 9:00 A M Medical Record Number: [id number] Patient Account Number: [id number] Date of Birth/Sex: Treating RN: 12-25-1945 (75 y.o. Tonita Phoenix, Lauren Primary Care Provider: Kelton Pillar Other Clinician: Referring Provider: Treating Provider/Extender: Lesle Chris in Treatment: 3 Constitutional Sitting or standing Blood Pressure is within target range for patient.. Pulse regular and within target range for patient.Marland Kitchen Respirations regular, non-labored and within target range.. Temperature is normal and within the target range for the patient.Marland Kitchen Appears in no distress. Notes Wound exam; right lateral lower leg. Wound looks healthy. Some scale around the circumference and parts but I did not debride anything today. Her edema control looks good. No evidence of surrounding infection Electronic Signature(s) Signed: 06/10/2021 5:29:17 PM By: Linton Ham MD Entered By: Linton Ham on 06/10/2021 10:37:03 -------------------------------------------------------------------------------- Physician Orders Details Patient Name: Date of Service: Jillene Bucks, [name] 06/10/2021 9:00 A M Medical Record Number: [id number] Patient Account Number: [id number] Date of Birth/Sex: Treating RN: 16-Oct-1946 (75 y.o. Tonita Phoenix, Lauren Primary Care Provider: Kelton Pillar Other Clinician: Referring Provider: Treating Provider/Extender: Lesle Chris in Treatment: 3 Verbal / Phone Orders: No Diagnosis Coding Follow-up Appointments Return Appointment in 1 week. Bathing/ Shower/ Hygiene May shower with protection but do not get wound dressing(s) wet. - Use cast protector Edema Control - Lymphedema / SCD /  Other Elevate legs to the level of the heart or above for 30 minutes daily and/or when sitting, a frequency of: - throughout the day Avoid standing for long periods of time. Exercise regularly Wound Treatment Wound #1 -  Lower Leg Wound Laterality: Right, Lateral Cleanser: Soap and Water 1 x Per Week Discharge Instructions: May shower and wash wound with dial antibacterial soap and water prior to dressing change. Peri-Wound Care: Sween Lotion (Moisturizing lotion) 1 x Per Week Discharge Instructions: Apply moisturizing lotion as directed Prim Dressing: Hydrofera Blue Classic Foam, 2x2 in 1 x Per Week ary Discharge Instructions: Moisten with saline prior to applying to wound bed Secondary Dressing: Woven Gauze Sponge, Non-Sterile 4x4 in 1 x Per Week Discharge Instructions: Apply over primary dressing as directed. Secondary Dressing: ABD Pad, 8x10 1 x Per Week Discharge Instructions: Apply over primary dressing as directed. Compression Wrap: ThreePress (3 layer compression wrap) 1 x Per Week Discharge Instructions: Apply three layer compression as directed. Electronic Signature(s) Signed: 06/10/2021 5:29:17 PM By: Linton Ham MD Signed: 06/10/2021 5:48:53 PM By: Rhae Hammock RN Entered By: Rhae Hammock on 06/10/2021 10:07:59 -------------------------------------------------------------------------------- Problem List Details Patient Name: Date of Service: [name], [name] 06/10/2021 9:00 A M Medical Record Number: [id number] Patient Account Number: [id number] Date of Birth/Sex: Treating RN: November 03, 1946 (75 y.o. Tonita Phoenix, Lauren Primary Care Provider: Kelton Pillar Other Clinician: Referring Provider: Treating Provider/Extender: Lesle Chris in Treatment: 3 Active Problems ICD-10 Encounter Code Description Active Date MDM Diagnosis S80.11XD Contusion of right lower leg, subsequent encounter 05/19/2021 No Yes L97.818 Non-pressure  chronic ulcer of other part of right lower leg with other specified 05/19/2021 No Yes severity Inactive Problems Resolved Problems Electronic Signature(s) Signed: 06/10/2021 5:29:17 PM By: Linton Ham MD Entered By: Linton Ham on 06/10/2021 10:35:52 -------------------------------------------------------------------------------- Progress Note Details Patient Name: Date of Service: [name], [name] 06/10/2021 9:00 A M Medical Record Number: [id number] Patient Account Number: [id number] Date of Birth/Sex: Treating RN: January 24, 1946 (75 y.o. Tonita Phoenix, Lauren Primary Care Provider: Kelton Pillar Other Clinician: Referring Provider: Treating Provider/Extender: Lesle Chris in Treatment: 3 Subjective History of Present Illness (HPI) ADMISSION 05/19/2021 This is a fairly healthy 75 year old woman who suffered a traumatic contusion to her right lateral lower leg about 3 weeks ago when her car door hit her leg in her garage. Apparently bled profusely. He saw primary care who referred her to dermatology. She was advised to "clean this aggressively" and was initially applying Neosporin more recently Vaseline. We do not have any of these notes. She was able to give [place] a pictorial history on her phone. Initially a fairly eschared surface. I am not sure if there is a hematoma underneath this but she has been scrubbing at this to remove all the debris from the surface she did a fairly good job. She also reports weeping edema fluid coming out of the surface of the wound. She does not have a wound history. She is not a diabetic Past medical history includes lumbar radiculopathy, hypertension, bradycardia, gastroesophageal reflux disease, irritable bowel syndrome ABIs in our clinic were 1.02 on the right and 1.04 in the left 6/13; patient suffered a car door contusion of the right lower leg. Completely nonviable surface last time which required debridement. We have  been using Hydrofera Blue under compression. 6/20; better looking wound surface still using Hydrofera Blue under compression dimensions are smaller 6/28; slightly smaller no depth wound looks healthy. Using Hydrofera Blue under compression Objective Constitutional Sitting or standing Blood Pressure is within target range for patient.. Pulse regular and within target range for patient.Marland Kitchen Respirations regular, non-labored and within target range.. Temperature is normal and within the target range for the  patient.Marland Kitchen Appears in no distress. Vitals Time Taken: 9:26 AM, Height: 66 in, Weight: 205 lbs, BMI: 33.1, Temperature: 98.1 F, Pulse: 59 bpm, Respiratory Rate: 16 breaths/min, Blood Pressure: 121/81 mmHg, Pulse Oximetry: 95 %. General Notes: Wound exam; right lateral lower leg. Wound looks healthy. Some scale around the circumference and parts but I did not debride anything today. Her edema control looks good. No evidence of surrounding infection Integumentary (Hair, Skin) Wound #1 status is Open. Original cause of wound was Trauma. The date acquired was: 04/28/2021. The wound has been in treatment 3 weeks. The wound is located on the Right,Lateral Lower Leg. The wound measures 1.9cm length x 1.6cm width x 0.1cm depth; 2.388cm^2 area and 0.239cm^3 volume. There is Fat Layer (Subcutaneous Tissue) exposed. There is no tunneling or undermining noted. There is a medium amount of serosanguineous drainage noted. The wound margin is distinct with the outline attached to the wound base. There is large (67-100%) red granulation within the wound bed. There is no necrotic tissue within the wound bed. Assessment Active Problems ICD-10 Contusion of right lower leg, subsequent encounter Non-pressure chronic ulcer of other part of right lower leg with other specified severity Procedures Wound #1 Pre-procedure diagnosis of Wound #1 is a Venous Leg Ulcer located on the Right,Lateral Lower Leg . There was a  Three Layer Compression Therapy Procedure by Rhae Hammock, RN. Post procedure Diagnosis Wound #1: Same as Pre-Procedure Plan Follow-up Appointments: Return Appointment in 1 week. Bathing/ Shower/ Hygiene: May shower with protection but do not get wound dressing(s) wet. - Use cast protector Edema Control - Lymphedema / SCD / Other: Elevate legs to the level of the heart or above for 30 minutes daily and/or when sitting, a frequency of: - throughout the day Avoid standing for long periods of time. Exercise regularly WOUND #1: - Lower Leg Wound Laterality: Right, Lateral Cleanser: Soap and Water 1 x Per Week/ Discharge Instructions: May shower and wash wound with dial antibacterial soap and water prior to dressing change. Peri-Wound Care: Sween Lotion (Moisturizing lotion) 1 x Per Week/ Discharge Instructions: Apply moisturizing lotion as directed Prim Dressing: Hydrofera Blue Classic Foam, 2x2 in 1 x Per Week/ ary Discharge Instructions: Moisten with saline prior to applying to wound bed Secondary Dressing: Woven Gauze Sponge, Non-Sterile 4x4 in 1 x Per Week/ Discharge Instructions: Apply over primary dressing as directed. Secondary Dressing: ABD Pad, 8x10 1 x Per Week/ Discharge Instructions: Apply over primary dressing as directed. Com pression Wrap: ThreePress (3 layer compression wrap) 1 x Per Week/ Discharge Instructions: Apply three layer compression as directed. 1. I continued with the same dressing which is Hydrofera Blue 2. Clearly still needs compression and judging by the edema difference in her wrap to right leg versus her left I think we are doing a good job. Electronic Signature(s) Signed: 07/03/2021 9:42:03 PM By: Deon Pilling Signed: 07/07/2021 8:23:39 AM By: Linton Ham MD Previous Signature: 07/03/2021 9:18:14 PM Version By: Deon Pilling Previous Signature: 06/10/2021 5:29:17 PM Version By: Linton Ham MD Entered By: Deon Pilling on 07/03/2021  21:41:26 -------------------------------------------------------------------------------- SuperBill Details Patient Name: Date of Service: [name], [name] 06/10/2021 Medical Record Number: [id number] Patient Account Number: [id number] Date of Birth/Sex: Treating RN: 1946-11-28 (75 y.o. Tonita Phoenix, Lauren Primary Care Provider: Kelton Pillar Other Clinician: Referring Provider: Treating Provider/Extender: Lesle Chris in Treatment: 3 Diagnosis Coding ICD-10 Codes Code Description S80.11XD Contusion of right lower leg, subsequent encounter L97.818 Non-pressure chronic ulcer of other part  of right lower leg with other specified severity Facility Procedures CPT4 Code: [id number] Description: (Facility Use Only) [id number] - [place] LWR RT LEG Modifier: Quantity: 1 Physician Procedures : CPT4 Code Description Modifier [id number] - WC PHYS LEVEL 3 - EST PT ICD-10 Diagnosis Description L97.818 Non-pressure chronic ulcer of other part of right lower leg with other specified severity S80.11XD Contusion of right lower leg, subsequent  encounter Quantity: 1 Electronic Signature(s) Signed: 06/10/2021 5:29:17 PM By: Linton Ham MD Entered By: Linton Ham on 06/10/2021 10:37:54

## 2021-06-13 DIAGNOSIS — Z20822 Contact with and (suspected) exposure to covid-19: Secondary | ICD-10-CM | POA: Diagnosis not present

## 2021-06-17 ENCOUNTER — Encounter (HOSPITAL_BASED_OUTPATIENT_CLINIC_OR_DEPARTMENT_OTHER): Payer: Medicare Other | Attending: Internal Medicine | Admitting: Internal Medicine

## 2021-06-17 ENCOUNTER — Other Ambulatory Visit: Payer: Self-pay

## 2021-06-17 DIAGNOSIS — W2209XA Striking against other stationary object, initial encounter: Secondary | ICD-10-CM | POA: Insufficient documentation

## 2021-06-17 DIAGNOSIS — I890 Lymphedema, not elsewhere classified: Secondary | ICD-10-CM | POA: Insufficient documentation

## 2021-06-17 DIAGNOSIS — L97812 Non-pressure chronic ulcer of other part of right lower leg with fat layer exposed: Secondary | ICD-10-CM | POA: Diagnosis not present

## 2021-06-17 DIAGNOSIS — S8011XA Contusion of right lower leg, initial encounter: Secondary | ICD-10-CM | POA: Insufficient documentation

## 2021-06-17 DIAGNOSIS — I872 Venous insufficiency (chronic) (peripheral): Secondary | ICD-10-CM | POA: Diagnosis not present

## 2021-06-17 DIAGNOSIS — L97818 Non-pressure chronic ulcer of other part of right lower leg with other specified severity: Secondary | ICD-10-CM | POA: Insufficient documentation

## 2021-06-18 NOTE — Progress Notes (Addendum)
EASTYN, SKALLA ([id number]) Visit Report for 06/17/2021 Arrival Information Details Patient Name: Date of Service: Abigail Guzman, Abigail W. 06/17/2021 9:00 A M Medical Record Number: [id number] Patient Account Number: [id number] Date of Birth/Sex: Treating RN: 1946-08-22 (75 y.o. Nita Sells Primary Care Kashira Behunin: Kelton Pillar Other Clinician: Referring Zaul Hubers: Treating Diedra Sinor/Extender: Lesle Chris in Treatment: 4 Visit Information History Since Last Visit All ordered tests and consults were completed: No Patient Arrived: Ambulatory Added or deleted any medications: No Arrival Time: 09:00 Any new allergies or adverse reactions: No Accompanied By: alone Had a fall or experienced change in No Transfer Assistance: None activities of daily living that may affect Patient Identification Verified: Yes risk of falls: Secondary Verification Process Completed: Yes Signs or symptoms of abuse/neglect since last visito No Patient Requires Transmission-Based Precautions: No Hospitalized since last visit: No Patient Has Alerts: No Implantable device outside of the clinic excluding No cellular tissue based products placed in the center since last visit: Has Dressing in Place as Prescribed: Yes Has Compression in Place as Prescribed: Yes Pain Present Now: No Electronic Signature(s) Signed: 06/17/2021 6:17:43 PM By: Leane Call Entered By: Leane Call on 06/17/2021 09:10:59 -------------------------------------------------------------------------------- Compression Therapy Details Patient Name: Date of Service: Abigail Guzman, Abigail [place] W. 06/17/2021 9:00 A M Medical Record Number: [id number] Patient Account Number: [id number] Date of Birth/Sex: Treating RN: March 30, 1946 (75 y.o. [name] Primary Care Shoshanah Dapper: Kelton Pillar Other Clinician: Referring Fauna Neuner: Treating Stephaun Million/Extender: Lesle Chris in Treatment:  4 Compression Therapy Performed for Wound Assessment: Wound #1 Right,Lateral Lower Leg Performed By: Clinician Lorrin Jackson, RN Compression Type: Three Layer Post Procedure Diagnosis Same as Pre-procedure Electronic Signature(s) Signed: 06/17/2021 7:08:24 PM By: Lorrin Jackson Entered By: Lorrin Jackson on 06/17/2021 09:22:21 -------------------------------------------------------------------------------- Encounter Discharge Information Details Patient Name: Date of Service: Abigail Guzman, [name] 06/17/2021 9:00 A M Medical Record Number: [id number] Patient Account Number: [id number] Date of Birth/Sex: Treating RN: [birth year] (75 y.o. [name] Primary Care Roxy Mastandrea: Kelton Pillar Other Clinician: Referring Khaleed Holan: Treating Raeana Blinn/Extender: Lesle Chris in Treatment: 4 Encounter Discharge Information Items Discharge Condition: Stable Ambulatory Status: Ambulatory Discharge Destination: Home Transportation: Private Auto Schedule Follow-up Appointment: Yes Clinical Summary of Care: Provided on 06/17/2021 Form Type Recipient Paper Patient Patient Electronic Signature(s) Signed: 06/17/2021 9:35:56 AM By: Lorrin Jackson Entered By: Lorrin Jackson on 06/17/2021 09:35:56 -------------------------------------------------------------------------------- Lower Extremity Assessment Details Patient Name: Date of Service: Abigail Guzman, Abigail NDRA W. 06/17/2021 9:00 A M Medical Record Number: [id number] Patient Account Number: [id number] Date of Birth/Sex: Treating RN: [birth year] (75 y.o. Nita Sells Primary Care Ikran Patman: Kelton Pillar Other Clinician: Referring Belford Pascucci: Treating Kelyn Koskela/Extender: Lesle Chris in Treatment: 4 Edema Assessment Assessed: Shirlyn Goltz: No] [Right: No] Edema: [Left: Ye] [Right: s] Calf Left: Right: Point of Measurement: 27 cm From Medial Instep 38.1 cm Ankle Left: Right: Point of  Measurement: 9 cm From Medial Instep 27.1 cm Vascular Assessment Pulses: Dorsalis Pedis Palpable: [Right:Yes] Electronic Signature(s) Signed: 06/17/2021 6:17:43 PM By: Leane Call Entered By: Leane Call on 06/17/2021 09:11:59 -------------------------------------------------------------------------------- Multi Wound Chart Details Patient Name: Date of Service: Abigail Guzman, Abigail W. 06/17/2021 9:00 A M Medical Record Number: [id number] Patient Account Number: [id number] Date of Birth/Sex: Treating RN: [birth year] (75 y.o. [name] Primary Care Makenah Karas: Kelton Pillar Other Clinician: Referring Jayana Kotula: Treating Keishana Klinger/Extender: Lesle Chris in Treatment: 4 Vital Signs Height(in): 66 Pulse(bpm): 60 Weight(lbs): 205  Blood Pressure(mmHg): 113/73 Body Mass Index(BMI): 33 Temperature(F): 97.6 Respiratory Rate(breaths/min): 18 Photos: [1:No Photos Right, Lateral Lower Leg] [N/A:N/A N/A] Wound Location: [1:Trauma] [N/A:N/A] Wounding Event: [1:Venous Leg Ulcer] [N/A:N/A] Primary Etiology: [1:Osteoarthritis] [N/A:N/A] Comorbid History: [1:04/28/2021] [N/A:N/A] Date Acquired: [1:4] [N/A:N/A] Weeks of Treatment: [1:Open] [N/A:N/A] Wound Status: [1:1.47x1x0.1] [N/A:N/A] Measurements L x W x D (cm) [1:1.155] [N/A:N/A] A (cm) : rea [1:0.115] [N/A:N/A] Volume (cm) : [1:82.40%] [N/A:N/A] % Reduction in A rea: [1:91.30%] [N/A:N/A] % Reduction in Volume: [1:Full Thickness Without Exposed] [N/A:N/A] Classification: [1:Support Structures Medium] [N/A:N/A] Exudate Amount: [1:Serosanguineous] [N/A:N/A] Exudate Type: [1:red, brown] [N/A:N/A] Exudate Color: [1:Distinct, outline attached] [N/A:N/A] Wound Margin: [1:Large (67-100%)] [N/A:N/A] Granulation Amount: [1:Red] [N/A:N/A] Granulation Quality: [1:None Present (0%)] [N/A:N/A] Necrotic Amount: [1:Fat Layer (Subcutaneous Tissue): Yes N/A] Exposed Structures: [1:Fascia: No Tendon: No  Muscle: No Joint: No Bone: No Medium (34-66%)] [N/A:N/A] Epithelialization: [1:Compression Therapy] [N/A:N/A] Treatment Notes Electronic Signature(s) Signed: 06/17/2021 7:08:24 PM By: Lorrin Jackson Signed: 06/18/2021 1:36:13 PM By: Linton Ham MD Entered By: Linton Ham on 06/17/2021 09:23:40 -------------------------------------------------------------------------------- Multi-Disciplinary Care Plan Details Patient Name: Date of Service: Abigail Guzman, Abigail NDRA W. 06/17/2021 9:00 A M Medical Record Number: [id number] Patient Account Number: [id number] Date of Birth/Sex: Treating RN: [birth year] (75 y.o. [name] Primary Care Leasha Goldberger: Kelton Pillar Other Clinician: Referring Verena Shawgo: Treating Debraann Livingstone/Extender: Lesle Chris in Treatment: 4 Multidisciplinary Care Plan reviewed with physician Active Inactive Venous Leg Ulcer Nursing Diagnoses: Knowledge deficit related to disease process and management Goals: Patient will maintain optimal edema control Date Initiated: 05/19/2021 Target Resolution Date: 06/20/2021 Goal Status: Active Patient/caregiver will verbalize understanding of disease process and disease management Date Initiated: 05/19/2021 Target Resolution Date: 06/20/2021 Goal Status: Active Interventions: Assess peripheral edema status every visit. Compression as ordered Provide education on venous insufficiency Notes: Wound/Skin Impairment Nursing Diagnoses: Impaired tissue integrity Knowledge deficit related to ulceration/compromised skin integrity Goals: Patient/caregiver will verbalize understanding of skin care regimen Date Initiated: 05/19/2021 Target Resolution Date: 06/20/2021 Goal Status: Active Ulcer/skin breakdown will have a volume reduction of 30% by week 4 Date Initiated: 05/19/2021 Target Resolution Date: 06/20/2021 Goal Status: Active Interventions: Assess patient/caregiver ability to obtain necessary supplies Assess  patient/caregiver ability to perform ulcer/skin care regimen upon admission and as needed Assess ulceration(s) every visit Provide education on ulcer and skin care Notes: Electronic Signature(s) Signed: 06/17/2021 7:08:24 PM By: Lorrin Jackson Entered By: Lorrin Jackson on 06/17/2021 09:19:43 -------------------------------------------------------------------------------- Pain Assessment Details Patient Name: Date of Service: Abigail Guzman, Abigail [place] W. 06/17/2021 9:00 A M Medical Record Number: [id number] Patient Account Number: [id number] Date of Birth/Sex: Treating RN: [birth year] (75 y.o. Nita Sells Primary Care Brinae Woods: Kelton Pillar Other Clinician: Referring [place] Lapaglia: Treating Irisha Grandmaison/Extender: Lesle Chris in Treatment: 4 Active Problems Location of Pain Severity and Description of Pain Patient Has Paino No Site Locations Rate the pain. Current Pain Level: 0 Pain Management and Medication Current Pain Management: Electronic Signature(s) Signed: 06/17/2021 6:17:43 PM By: Leane Call Entered By: Leane Call on 06/17/2021 09:11:46 -------------------------------------------------------------------------------- Patient/Caregiver Education Details Patient Name: Date of Service: Abigail Guzman, Abigail NDRA W. 7/5/2022andnbsp9:00 A M Medical Record Number: [id number] Patient Account Number: [id number] Date of Birth/Gender: Treating RN: 07/01/1946 (75 y.o. [name] Primary Care Physician: Kelton Pillar Other Clinician: Referring Physician: Treating Physician/Extender: Lesle Chris in Treatment: 4 Education Assessment Education Provided To: Patient Education Topics Provided Venous: Methods: Explain/Verbal, Printed Responses: State content correctly Wound/Skin Impairment: Methods: Explain/Verbal, Printed Responses: State content correctly Electronic  Signature(s) Signed: 06/17/2021 7:08:24 PM By:  Lorrin Jackson Entered By: Lorrin Jackson on 06/17/2021 09:20:05 -------------------------------------------------------------------------------- Wound Assessment Details Patient Name: Date of Service: Abigail Guzman, Abigail NDRA W. 06/17/2021 9:00 A M Medical Record Number: [id number] Patient Account Number: [id number] Date of Birth/Sex: Treating RN: 02-01-1946 (75 y.o. Nita Sells Primary Care Perry Brucato: Kelton Pillar Other Clinician: Referring Shayne Diguglielmo: Treating Permelia Bamba/Extender: Lesle Chris in Treatment: 4 Wound Status Wound Number: 1 Primary Etiology: Venous Leg Ulcer Wound Location: Right, Lateral Lower Leg Wound Status: Open Wounding Event: Trauma Comorbid History: Osteoarthritis Date Acquired: 04/28/2021 Weeks Of Treatment: 4 Clustered Wound: No Photos Wound Measurements Length: (cm) 1.47 Width: (cm) 1 Depth: (cm) 0.1 Area: (cm) 1.155 Volume: (cm) 0.115 % Reduction in Area: 82.4% % Reduction in Volume: 91.3% Epithelialization: Medium (34-66%) Tunneling: No Undermining: No Wound Description Classification: Full Thickness Without Exposed Support Structures Wound Margin: Distinct, outline attached Exudate Amount: Medium Exudate Type: Serosanguineous Exudate Color: red, brown Foul Odor After Cleansing: No Slough/Fibrino No Wound Bed Granulation Amount: Large (67-100%) Exposed Structure Granulation Quality: Red Fascia Exposed: No Necrotic Amount: None Present (0%) Fat Layer (Subcutaneous Tissue) Exposed: Yes Tendon Exposed: No Muscle Exposed: No Joint Exposed: No Bone Exposed: No Treatment Notes Wound #1 (Lower Leg) Wound Laterality: Right, Lateral Cleanser Soap and Water Discharge Instruction: May shower and wash wound with dial antibacterial soap and water prior to dressing change. Peri-Wound Care Sween Lotion (Moisturizing lotion) Discharge Instruction: Apply moisturizing lotion as directed Topical Primary  Dressing Hydrofera Blue Classic Foam, 2x2 in Discharge Instruction: Moisten with saline prior to applying to wound bed Secondary Dressing Woven Gauze Sponge, Non-Sterile 4x4 in Discharge Instruction: Apply over primary dressing as directed. ABD Pad, 8x10 Discharge Instruction: Apply over primary dressing as directed. Secured With Compression Wrap ThreePress (3 layer compression wrap) Discharge Instruction: Apply three layer compression as directed. Compression Stockings Add-Ons Electronic Signature(s) Signed: 06/18/2021 5:07:06 PM By: Leane Call Signed: 06/18/2021 5:22:20 PM By: Sandre Kitty Previous Signature: 06/17/2021 6:17:43 PM Version By: Leane Call Entered By: Sandre Kitty on 06/18/2021 14:49:45 -------------------------------------------------------------------------------- Vitals Details Patient Name: Date of Service: Abigail Guzman, Abigail NDRA W. 06/17/2021 9:00 A M Medical Record Number: [id number] Patient Account Number: [id number] Date of Birth/Sex: Treating RN: [birth year] (76 y.o. Nita Sells Primary Care Halo Laski: Kelton Pillar Other Clinician: Referring Ellia Knowlton: Treating Teegan Brandis/Extender: Lesle Chris in Treatment: 4 Vital Signs Time Taken: 09:05 Temperature (F): 97.6 Height (in): 66 Pulse (bpm): 60 Weight (lbs): 205 Respiratory Rate (breaths/min): 18 Body Mass Index (BMI): 33.1 Blood Pressure (mmHg): 113/73 Reference Range: 80 - 120 mg / dl Airway Pulse Oximetry (%): 96 Electronic Signature(s) Signed: 06/17/2021 6:17:43 PM By: Leane Call Entered By: Leane Call on 06/17/2021 09:11:36

## 2021-06-18 NOTE — Progress Notes (Signed)
Abigail Guzman, Abigail Guzman ([id number]) Visit Report for 06/17/2021 HPI Details Patient Name: Date of Service: Abigail Guzman, [place] Abigail W. 06/17/2021 9:00 A M Medical Record Number: [id number] Patient Account Number: [id number] Date of Birth/Sex: Treating RN: May 26, 1946 (75 y.o. Abigail Guzman Primary Care Provider: Kelton Guzman Other Clinician: Referring Provider: Treating Provider/Extender: Abigail Guzman in Treatment: 4 History of Present Illness HPI Description: ADMISSION 05/19/2021 This is a fairly healthy 75 year old woman who suffered a traumatic contusion to her right lateral lower leg about 3 weeks ago when her car door hit her leg in her garage. Apparently bled profusely. He saw primary care who referred her to dermatology. She was advised to "clean this aggressively" and was initially applying Neosporin more recently Vaseline. We do not have any of these notes. She was able to give [place] a pictorial history on her phone. Initially a fairly eschared surface. I am not sure if there is a hematoma underneath this but she has been scrubbing at this to remove all the debris from the surface she did a fairly good job. She also reports weeping edema fluid coming out of the surface of the wound. She does not have a wound history. She is not a diabetic Past medical history includes lumbar radiculopathy, hypertension, bradycardia, gastroesophageal reflux disease, irritable bowel syndrome ABIs in our clinic were 1.02 on the right and 1.04 in the left 6/13; patient suffered a car door contusion of the right lower leg. Completely nonviable surface last time which required debridement. We have been using Hydrofera Blue under compression. 6/20; better looking wound surface still using Hydrofera Blue under compression dimensions are smaller 6/28; slightly smaller no depth wound looks healthy. Using Hydrofera Blue under compression 7/5; wound is smaller healthy looking using Hydrofera Blue  under compression Electronic Signature(s) Signed: 06/18/2021 1:36:13 PM By: Abigail Ham MD Entered By: Abigail Guzman on 06/17/2021 09:24:02 -------------------------------------------------------------------------------- Physical Exam Details Patient Name: Date of Service: Abigail Guzman, Abigail W. 06/17/2021 9:00 A M Medical Record Number: [id number] Patient Account Number: [id number] Date of Birth/Sex: Treating RN: March 25, 1946 (75 y.o. Abigail Guzman Primary Care Provider: Kelton Guzman Other Clinician: Referring Provider: Treating Provider/Extender: Abigail Guzman in Treatment: 4 Constitutional Sitting or standing Blood Pressure is within target range for patient.. Pulse regular and within target range for patient.Marland Kitchen Respirations regular, non-labored and within target range.. Temperature is normal and within the target range for the patient.Marland Kitchen Appears in no distress. Cardiovascular Pedal pulses are palpable. Edema control is good. Notes Wound exam; right lateral lower leg healthy looking granulation advancing epithelialization. No need for debridement. No evidence of surrounding infection. Surrounding tissue looks healthy Electronic Signature(s) Signed: 06/18/2021 1:36:13 PM By: Abigail Ham MD Entered By: Abigail Guzman on 06/17/2021 09:25:09 -------------------------------------------------------------------------------- Physician Orders Details Patient Name: Date of Service: Abigail Guzman, [name] 06/17/2021 9:00 A M Medical Record Number: [id number] Patient Account Number: [id number] Date of Birth/Sex: Treating RN: 1946-11-04 (75 y.o. Abigail Guzman Primary Care Provider: Kelton Guzman Other Clinician: Referring Provider: Treating Provider/Extender: Abigail Guzman in Treatment: 4 Verbal / Phone Orders: No Diagnosis Coding ICD-10 Coding Code Description S80.11XD Contusion of right lower leg, subsequent  encounter L97.818 Non-pressure chronic ulcer of other part of right lower leg with other specified severity Follow-up Appointments ppointment in 1 week. - with Dr. Dellia Nims Return A Bathing/ Shower/ Hygiene May shower with protection but do not get wound dressing(s) wet. - Use cast protector Edema Control -  Lymphedema / SCD / Other Elevate legs to the level of the heart or above for 30 minutes daily and/or when sitting, a frequency of: - throughout the day Avoid standing for long periods of time. Exercise regularly Wound Treatment Wound #1 - Lower Leg Wound Laterality: Right, Lateral Cleanser: Soap and Water 1 x Per Week Discharge Instructions: May shower and wash wound with dial antibacterial soap and water prior to dressing change. Peri-Wound Care: Sween Lotion (Moisturizing lotion) 1 x Per Week Discharge Instructions: Apply moisturizing lotion as directed Prim Dressing: Hydrofera Blue Classic Foam, 2x2 in 1 x Per Week ary Discharge Instructions: Moisten with saline prior to applying to wound bed Secondary Dressing: Woven Gauze Sponge, Non-Sterile 4x4 in 1 x Per Week Discharge Instructions: Apply over primary dressing as directed. Secondary Dressing: ABD Pad, 8x10 1 x Per Week Discharge Instructions: Apply over primary dressing as directed. Compression Wrap: ThreePress (3 layer compression wrap) 1 x Per Week Discharge Instructions: Apply three layer compression as directed. Electronic Signature(s) Signed: 06/17/2021 7:08:24 PM By: Lorrin Jackson Signed: 06/18/2021 1:36:13 PM By: Abigail Ham MD Entered By: Lorrin Jackson on 06/17/2021 09:22:02 -------------------------------------------------------------------------------- Problem List Details Patient Name: Date of Service: Abigail Guzman, [place] Abigail W. 06/17/2021 9:00 A M Medical Record Number: [id number] Patient Account Number: [id number] Date of Birth/Sex: Treating RN: 06/13/1946 (75 y.o. Abigail Guzman Primary Care Provider:  Kelton Guzman Other Clinician: Referring Provider: Treating Provider/Extender: Abigail Guzman in Treatment: 4 Active Problems ICD-10 Encounter Code Description Active Date MDM Diagnosis S80.11XD Contusion of right lower leg, subsequent encounter 05/19/2021 No Yes L97.818 Non-pressure chronic ulcer of other part of right lower leg with other specified 05/19/2021 No Yes severity Inactive Problems Resolved Problems Electronic Signature(s) Signed: 06/18/2021 1:36:13 PM By: Abigail Ham MD Entered By: Abigail Guzman on 06/17/2021 09:23:32 -------------------------------------------------------------------------------- Progress Note Details Patient Name: Date of Service: Abigail Guzman, [place] Abigail W. 06/17/2021 9:00 A M Medical Record Number: [id number] Patient Account Number: [id number] Date of Birth/Sex: Treating RN: [birth year] (75 y.o. Abigail Guzman Primary Care Provider: Kelton Guzman Other Clinician: Referring Provider: Treating Provider/Extender: Abigail Guzman in Treatment: 4 Subjective History of Present Illness (HPI) ADMISSION 05/19/2021 This is a fairly healthy 74 year old woman who suffered a traumatic contusion to her right lateral lower leg about 3 weeks ago when her car door hit her leg in her garage. Apparently bled profusely. He saw primary care who referred her to dermatology. She was advised to "clean this aggressively" and was initially applying Neosporin more recently Vaseline. We do not have any of these notes. She was able to give [place] a pictorial history on her phone. Initially a fairly eschared surface. I am not sure if there is a hematoma underneath this but she has been scrubbing at this to remove all the debris from the surface she did a fairly good job. She also reports weeping edema fluid coming out of the surface of the wound. She does not have a wound history. She is not a diabetic Past medical history includes  lumbar radiculopathy, hypertension, bradycardia, gastroesophageal reflux disease, irritable bowel syndrome ABIs in our clinic were 1.02 on the right and 1.04 in the left 6/13; patient suffered a car door contusion of the right lower leg. Completely nonviable surface last time which required debridement. We have been using Hydrofera Blue under compression. 6/20; better looking wound surface still using Hydrofera Blue under compression dimensions are smaller 6/28; slightly smaller no depth wound looks healthy. Using  Hydrofera Blue under compression 7/5; wound is smaller healthy looking using Hydrofera Blue under compression Objective Constitutional Sitting or standing Blood Pressure is within target range for patient.. Pulse regular and within target range for patient.Marland Kitchen Respirations regular, non-labored and within target range.. Temperature is normal and within the target range for the patient.Marland Kitchen Appears in no distress. Vitals Time Taken: 9:05 AM, Height: 66 in, Weight: 205 lbs, BMI: 33.1, Temperature: 97.6 F, Pulse: 60 bpm, Respiratory Rate: 18 breaths/min, Blood Pressure: 113/73 mmHg, Pulse Oximetry: 96 %. Cardiovascular Pedal pulses are palpable. Edema control is good. General Notes: Wound exam; right lateral lower leg healthy looking granulation advancing epithelialization. No need for debridement. No evidence of surrounding infection. Surrounding tissue looks healthy Integumentary (Hair, Skin) Wound #1 status is Open. Original cause of wound was Trauma. The date acquired was: 04/28/2021. The wound has been in treatment 4 weeks. The wound is located on the Right,Lateral Lower Leg. The wound measures 1.47cm length x 1cm width x 0.1cm depth; 1.155cm^2 area and 0.115cm^3 volume. There is Fat Layer (Subcutaneous Tissue) exposed. There is no tunneling or undermining noted. There is a medium amount of serosanguineous drainage noted. The wound margin is distinct with the outline attached to the wound  base. There is large (67-100%) red granulation within the wound bed. There is no necrotic tissue within the wound bed. Assessment Active Problems ICD-10 Contusion of right lower leg, subsequent encounter Non-pressure chronic ulcer of other part of right lower leg with other specified severity Procedures Wound #1 Pre-procedure diagnosis of Wound #1 is a Venous Leg Ulcer located on the Right,Lateral Lower Leg . There was a Three Layer Compression Therapy Procedure by Lorrin Jackson, RN. Post procedure Diagnosis Wound #1: Same as Pre-Procedure Plan Follow-up Appointments: Return Appointment in 1 week. - with Dr. Ar[place]a Jansky Shower/ Hygiene: May shower with protection but do not get wound dressing(s) wet. - Use cast protector Edema Control - Lymphedema / SCD / Other: Elevate legs to the level of the heart or above for 30 minutes daily and/or when sitting, a frequency of: - throughout the day Avoid standing for long periods of time. Exercise regularly WOUND #1: - Lower Leg Wound Laterality: Right, Lateral Cleanser: Soap and Water 1 x Per Week/ Discharge Instructions: May shower and wash wound with dial antibacterial soap and water prior to dressing change. Peri-Wound Care: Sween Lotion (Moisturizing lotion) 1 x Per Week/ Discharge Instructions: Apply moisturizing lotion as directed Prim Dressing: Hydrofera Blue Classic Foam, 2x2 in 1 x Per Week/ ary Discharge Instructions: Moisten with saline prior to applying to wound bed Secondary Dressing: Woven Gauze Sponge, Non-Sterile 4x4 in 1 x Per Week/ Discharge Instructions: Apply over primary dressing as directed. Secondary Dressing: ABD Pad, 8x10 1 x Per Week/ Discharge Instructions: Apply over primary dressing as directed. Com pression Wrap: ThreePress (3 layer compression wrap) 1 x Per Week/ Discharge Instructions: Apply three layer compression as directed. 1. No change the primary dressing Hydrofera Blue, ABDs under 3 layer  compression Electronic Signature(s) Signed: 06/18/2021 1:36:13 PM By: Abigail Ham MD Entered By: Abigail Guzman on 06/17/2021 09:25:53 -------------------------------------------------------------------------------- SuperBill Details Patient Name: Date of Service: Abigail Guzman, [place] Abigail W. 06/17/2021 Medical Record Number: [id number] Patient Account Number: [id number] Date of Birth/Sex: Treating RN: 27-Jun-1946 (75 y.o. Abigail Guzman Primary Care Provider: Kelton Guzman Other Clinician: Referring Provider: Treating Provider/Extender: Abigail Guzman in Treatment: 4 Diagnosis Coding ICD-10 Codes Code Description S80.11XD Contusion of right lower leg, subsequent encounter L97.818 Non-pressure  chronic ulcer of other part of right lower leg with other specified severity Facility Procedures CPT4 Code: [id number] Description: (Facility Use Only) [phone] - [facility] COMPRS LWR RT LEG ICD-10 Diagnosis Description L97.818 Non-pressure chronic ulcer of other part of right lower leg with other specified sever Modifier: ity Quantity: 1 Physician Procedures : CPT4 Code Description Modifier [id number] - WC PHYS LEVEL 3 - EST PT ICD-10 Diagnosis Description S80.11XD Contusion of right lower leg, subsequent encounter L97.818 Non-pressure chronic ulcer of other part of right lower leg with other specified  severity Quantity: 1 Electronic Signature(s) Signed: 06/18/2021 1:36:13 PM By: Abigail Ham MD Entered By: Abigail Guzman on 06/17/2021 09:26:10

## 2021-06-24 ENCOUNTER — Encounter (HOSPITAL_BASED_OUTPATIENT_CLINIC_OR_DEPARTMENT_OTHER): Payer: Medicare Other | Admitting: Internal Medicine

## 2021-06-24 ENCOUNTER — Other Ambulatory Visit: Payer: Self-pay

## 2021-06-24 DIAGNOSIS — I872 Venous insufficiency (chronic) (peripheral): Secondary | ICD-10-CM | POA: Diagnosis not present

## 2021-06-24 DIAGNOSIS — L97812 Non-pressure chronic ulcer of other part of right lower leg with fat layer exposed: Secondary | ICD-10-CM | POA: Diagnosis not present

## 2021-06-24 DIAGNOSIS — I890 Lymphedema, not elsewhere classified: Secondary | ICD-10-CM | POA: Diagnosis not present

## 2021-06-24 DIAGNOSIS — S8011XA Contusion of right lower leg, initial encounter: Secondary | ICD-10-CM | POA: Diagnosis not present

## 2021-06-24 DIAGNOSIS — L97818 Non-pressure chronic ulcer of other part of right lower leg with other specified severity: Secondary | ICD-10-CM | POA: Diagnosis not present

## 2021-06-25 NOTE — Progress Notes (Signed)
JAMYAH, FOLK ([id number]) Visit Report for 06/24/2021 HPI Details Patient Name: Date of Service: [name], [place] NDRA W. 06/24/2021 1:00 PM Medical Record Number: [id number] Patient Account Number: [id number] Date of Birth/Sex: Treating RN: [birth year] (75 y.o. Tonita Phoenix, Lauren Primary Care Provider: Kelton Pillar Other Clinician: Referring Provider: Treating Provider/Extender: Lesle Chris in Treatment: 5 History of Present Illness HPI Description: ADMISSION 05/19/2021 This is a fairly healthy 75 year old woman who suffered a traumatic contusion to her right lateral lower leg about 3 weeks ago when her car door hit her leg in her garage. Apparently bled profusely. He saw primary care who referred her to dermatology. She was advised to "clean this aggressively" and was initially applying Neosporin more recently Vaseline. We do not have any of these notes. She was able to give [place] a pictorial history on her phone. Initially a fairly eschared surface. I am not sure if there is a hematoma underneath this but she has been scrubbing at this to remove all the debris from the surface she did a fairly good job. She also reports weeping edema fluid coming out of the surface of the wound. She does not have a wound history. She is not a diabetic Past medical history includes lumbar radiculopathy, hypertension, bradycardia, gastroesophageal reflux disease, irritable bowel syndrome ABIs in our clinic were 1.02 on the right and 1.04 in the left 6/13; patient suffered a car door contusion of the right lower leg. Completely nonviable surface last time which required debridement. We have been using Hydrofera Blue under compression. 6/20; better looking wound surface still using Hydrofera Blue under compression dimensions are smaller 6/28; slightly smaller no depth wound looks healthy. Using Hydrofera Blue under compression 7/5; wound is smaller healthy looking using Hydrofera  Blue under compression 7/12; substantial improvement in surface area only 2 small areas remain. Using Hydrofera Blue under compression Electronic Signature(s) Signed: 06/24/2021 5:25:56 PM By: Linton Ham MD Entered By: Linton Ham on 06/24/2021 13:52:45 -------------------------------------------------------------------------------- Physical Exam Details Patient Name: Date of Service: [name], [place] W. 06/24/2021 1:00 PM Medical Record Number: [id number] Patient Account Number: [id number] Date of Birth/Sex: Treating RN: [birth year] (75 y.o. Tonita Phoenix, Lauren Primary Care Provider: Kelton Pillar Other Clinician: Referring Provider: Treating Provider/Extender: Lesle Chris in Treatment: 5 Constitutional Sitting or standing Blood Pressure is within target range for patient.. Pulse regular and within target range for patient.Marland Kitchen Respirations regular, non-labored and within target range.. Temperature is normal and within the target range for the patient.Marland Kitchen Appears in no distress. Notes Wound exam; right lateral lower leg. This is almost completely epithelialized although the epithelialization appears somewhat fragile. There are indeed 2 small open areas that remain under illumination. There is no evidence of surrounding infection Electronic Signature(s) Signed: 06/24/2021 5:25:56 PM By: Linton Ham MD Entered By: Linton Ham on 06/24/2021 13:53:33 -------------------------------------------------------------------------------- Physician Orders Details Patient Name: Date of Service: [name], [place] W. 06/24/2021 1:00 PM Medical Record Number: [id number] Patient Account Number: [id number] Date of Birth/Sex: Treating RN: [birth year] (75 y.o. Tonita Phoenix, Lauren Primary Care Provider: Kelton Pillar Other Clinician: Referring Provider: Treating Provider/Extender: Lesle Chris in Treatment: 5 Verbal / Phone Orders:  No Diagnosis Coding Follow-up Appointments ppointment in 1 week. - with Dr. Dellia Nims Return A Bathing/ Shower/ Hygiene May shower with protection but do not get wound dressing(s) wet. - Use cast protector Edema Control - Lymphedema / SCD / Other Elevate legs to the level of the  heart or above for 30 minutes daily and/or when sitting, a frequency of: - throughout the day Avoid standing for long periods of time. Exercise regularly Wound Treatment Wound #1 - Lower Leg Wound Laterality: Right, Lateral Cleanser: Soap and Water 1 x Per Week Discharge Instructions: May shower and wash wound with dial antibacterial soap and water prior to dressing change. Peri-Wound Care: Sween Lotion (Moisturizing lotion) 1 x Per Week Discharge Instructions: Apply moisturizing lotion as directed Prim Dressing: Hydrofera Blue Classic Foam, 2x2 in 1 x Per Week ary Discharge Instructions: Moisten with saline prior to applying to wound bed Secondary Dressing: Woven Gauze Sponge, Non-Sterile 4x4 in 1 x Per Week Discharge Instructions: Apply over primary dressing as directed. Secondary Dressing: ABD Pad, 8x10 1 x Per Week Discharge Instructions: Apply over primary dressing as directed. Compression Wrap: ThreePress (3 layer compression wrap) 1 x Per Week Discharge Instructions: Apply three layer compression as directed. Electronic Signature(s) Signed: 06/24/2021 5:25:56 PM By: Linton Ham MD Signed: 06/25/2021 6:12:45 PM By: Rhae Hammock RN Entered By: Rhae Hammock on 06/24/2021 13:38:53 -------------------------------------------------------------------------------- Problem List Details Patient Name: Date of Service: [name], [place] W. 06/24/2021 1:00 PM Medical Record Number: [id number] Patient Account Number: [id number] Date of Birth/Sex: Treating RN: [birth year] (75 y.o. Tonita Phoenix, Lauren Primary Care Provider: Kelton Pillar Other Clinician: Referring Provider: Treating  Provider/Extender: Lesle Chris in Treatment: 5 Active Problems ICD-10 Encounter Code Description Active Date MDM Diagnosis S80.11XD Contusion of right lower leg, subsequent encounter 05/19/2021 No Yes L97.818 Non-pressure chronic ulcer of other part of right lower leg with other specified 05/19/2021 No Yes severity Inactive Problems Resolved Problems Electronic Signature(s) Signed: 06/24/2021 5:25:56 PM By: Linton Ham MD Entered By: Linton Ham on 06/24/2021 13:52:06 -------------------------------------------------------------------------------- Progress Note Details Patient Name: Date of Service: Jillene Bucks, [name] 06/24/2021 1:00 PM Medical Record Number: [id number] Patient Account Number: [id number] Date of Birth/Sex: Treating RN: 15-Nov-1946 (75 y.o. Tonita Phoenix, Lauren Primary Care Provider: Kelton Pillar Other Clinician: Referring Provider: Treating Provider/Extender: Lesle Chris in Treatment: 5 Subjective History of Present Illness (HPI) ADMISSION 05/19/2021 This is a fairly healthy 75 year old woman who suffered a traumatic contusion to her right lateral lower leg about 3 weeks ago when her car door hit her leg in her garage. Apparently bled profusely. He saw primary care who referred her to dermatology. She was advised to "clean this aggressively" and was initially applying Neosporin more recently Vaseline. We do not have any of these notes. She was able to give [place] a pictorial history on her phone. Initially a fairly eschared surface. I am not sure if there is a hematoma underneath this but she has been scrubbing at this to remove all the debris from the surface she did a fairly good job. She also reports weeping edema fluid coming out of the surface of the wound. She does not have a wound history. She is not a diabetic Past medical history includes lumbar radiculopathy, hypertension, bradycardia,  gastroesophageal reflux disease, irritable bowel syndrome ABIs in our clinic were 1.02 on the right and 1.04 in the left 6/13; patient suffered a car door contusion of the right lower leg. Completely nonviable surface last time which required debridement. We have been using Hydrofera Blue under compression. 6/20; better looking wound surface still using Hydrofera Blue under compression dimensions are smaller 6/28; slightly smaller no depth wound looks healthy. Using Hydrofera Blue under compression 7/5; wound is smaller healthy looking using Hydrofera Blue  under compression 7/12; substantial improvement in surface area only 2 small areas remain. Using Hydrofera Blue under compression Objective Constitutional Sitting or standing Blood Pressure is within target range for patient.. Pulse regular and within target range for patient.Marland Kitchen Respirations regular, non-labored and within target range.. Temperature is normal and within the target range for the patient.Marland Kitchen Appears in no distress. Vitals Time Taken: 1:07 PM, Height: 66 in, Weight: 205 lbs, BMI: 33.1, Temperature: 98.1 F, Pulse: 66 bpm, Respiratory Rate: 16 breaths/min, Blood Pressure: 113/74 mmHg. General Notes: Wound exam; right lateral lower leg. This is almost completely epithelialized although the epithelialization appears somewhat fragile. There are indeed 2 small open areas that remain under illumination. There is no evidence of surrounding infection Integumentary (Hair, Skin) Wound #1 status is Open. Original cause of wound was Trauma. The date acquired was: 04/28/2021. The wound has been in treatment 5 weeks. The wound is located on the Right,Lateral Lower Leg. The wound measures 1cm length x 0.4cm width x 0.1cm depth; 0.314cm^2 area and 0.031cm^3 volume. There is Fat Layer (Subcutaneous Tissue) exposed. There is no tunneling or undermining noted. There is a medium amount of serosanguineous drainage noted. The wound margin is distinct with  the outline attached to the wound base. There is large (67-100%) red granulation within the wound bed. There is no necrotic tissue within the wound bed. Assessment Active Problems ICD-10 Contusion of right lower leg, subsequent encounter Non-pressure chronic ulcer of other part of right lower leg with other specified severity Procedures Wound #1 Pre-procedure diagnosis of Wound #1 is a Venous Leg Ulcer located on the Right,Lateral Lower Leg . There was a Three Layer Compression Therapy Procedure by Rhae Hammock, RN. Post procedure Diagnosis Wound #1: Same as Pre-Procedure Plan Follow-up Appointments: Return Appointment in 1 week. - with Dr. Arcola Jansky Shower/ Hygiene: May shower with protection but do not get wound dressing(s) wet. - Use cast protector Edema Control - Lymphedema / SCD / Other: Elevate legs to the level of the heart or above for 30 minutes daily and/or when sitting, a frequency of: - throughout the day Avoid standing for long periods of time. Exercise regularly WOUND #1: - Lower Leg Wound Laterality: Right, Lateral Cleanser: Soap and Water 1 x Per Week/ Discharge Instructions: May shower and wash wound with dial antibacterial soap and water prior to dressing change. Peri-Wound Care: Sween Lotion (Moisturizing lotion) 1 x Per Week/ Discharge Instructions: Apply moisturizing lotion as directed Prim Dressing: Hydrofera Blue Classic Foam, 2x2 in 1 x Per Week/ ary Discharge Instructions: Moisten with saline prior to applying to wound bed Secondary Dressing: Woven Gauze Sponge, Non-Sterile 4x4 in 1 x Per Week/ Discharge Instructions: Apply over primary dressing as directed. Secondary Dressing: ABD Pad, 8x10 1 x Per Week/ Discharge Instructions: Apply over primary dressing as directed. Com pression Wrap: ThreePress (3 layer compression wrap) 1 x Per Week/ Discharge Instructions: Apply three layer compression as directed. 1. Continue with Hydrofera Blue under  compression 2. Should be closed by next week 3. She probably has some degree of chronic venous insufficiency although I do not suspect she will agree to compression stockings Electronic Signature(s) Signed: 06/24/2021 5:25:56 PM By: Linton Ham MD Entered By: Linton Ham on 06/24/2021 13:54:26 -------------------------------------------------------------------------------- SuperBill Details Patient Name: Date of Service: Jillene Bucks, [name] 06/24/2021 Medical Record Number: [id number] Patient Account Number: [id number] Date of Birth/Sex: Treating RN: 19-Oct-1946 (75 y.o. Tonita Phoenix, Lauren Primary Care Provider: Kelton Pillar Other Clinician: Referring Provider: Treating Provider/Extender: Dellia Nims  Vista Lawman, Duaine Dredge in Treatment: 5 Diagnosis Coding ICD-10 Codes Code Description S80.11XD Contusion of right lower leg, subsequent encounter L97.818 Non-pressure chronic ulcer of other part of right lower leg with other specified severity Facility Procedures CPT4 Code: [id number] Description: (Facility Use Only) [id number] - [place] [facility] RT LEG Modifier: Quantity: 1 Physician Procedures : CPT4 Code Description Modifier [id number] - WC PHYS LEVEL 3 - EST PT ICD-10 Diagnosis Description S80.11XD Contusion of right lower leg, subsequent encounter L97.818 Non-pressure chronic ulcer of other part of right lower leg with other specified  severity Quantity: 1 Electronic Signature(s) Signed: 06/24/2021 5:25:56 PM By: Linton Ham MD Entered By: Linton Ham on 06/24/2021 13:54:48

## 2021-06-30 NOTE — Progress Notes (Signed)
KERIA, WIDRIG ([id number]) Visit Report for 06/24/2021 Arrival Information Details Patient Name: Date of Service: Jillene Bucks, [place] NDRA W. 06/24/2021 1:00 PM Medical Record Number: [id number] Patient Account Number: [id number] Date of Birth/Sex: Treating RN: 1946-06-06 (75 y.o. [name] Primary Care Ammaar Encina: Kelton Pillar Other Clinician: Referring Alexyia Guarino: Treating Jaydence Arnesen/Extender: Lesle Chris in Treatment: 5 Visit Information History Since Last Visit Added or deleted any medications: No Patient Arrived: Ambulatory Any new allergies or adverse reactions: No Arrival Time: 13:04 Had a fall or experienced change in No Transfer Assistance: None activities of daily living that may affect Patient Identification Verified: Yes risk of falls: Secondary Verification Process Completed: Yes Signs or symptoms of abuse/neglect since last visito No Patient Requires Transmission-Based Precautions: No Hospitalized since last visit: No Patient Has Alerts: No Implantable device outside of the clinic excluding No cellular tissue based products placed in the center since last visit: Has Dressing in Place as Prescribed: Yes Has Compression in Place as Prescribed: Yes Pain Present Now: No Electronic Signature(s) Signed: 06/24/2021 5:30:58 PM By: Lorrin Jackson Entered By: Lorrin Jackson on 06/24/2021 13:07:26 -------------------------------------------------------------------------------- Compression Therapy Details Patient Name: Date of Service: [name], [place] W. 06/24/2021 1:00 PM Medical Record Number: [id number] Patient Account Number: [id number] Date of Birth/Sex: Treating RN: Aug 01, 1946 (75 y.o. Tonita Phoenix, Lauren Primary Care Malvin Morrish: Kelton Pillar Other Clinician: Referring Kathryn Linarez: Treating Nyeem Stoke/Extender: Lesle Chris in Treatment: 5 Compression Therapy Performed for Wound Assessment: Wound #1  Right,Lateral Lower Leg Performed By: Clinician Rhae Hammock, RN Compression Type: Three Layer Post Procedure Diagnosis Same as Pre-procedure Electronic Signature(s) Signed: 06/25/2021 6:12:45 PM By: Rhae Hammock RN Entered By: Rhae Hammock on 06/24/2021 13:41:02 -------------------------------------------------------------------------------- Encounter Discharge Information Details Patient Name: Date of Service: [name], [name] 06/24/2021 1:00 PM Medical Record Number: [id number] Patient Account Number: [id number] Date of Birth/Sex: Treating RN: Feb 10, 1946 (75 y.o. [name] Primary Care Agnes Brightbill: Kelton Pillar Other Clinician: Referring Wiletta Bermingham: Treating Glori Machnik/Extender: Lesle Chris in Treatment: 5 Encounter Discharge Information Items Discharge Condition: Stable Ambulatory Status: Ambulatory Discharge Destination: Home Transportation: Private Auto Schedule Follow-up Appointment: Yes Clinical Summary of Care: Provided on 06/17/2021 Form Type Recipient Paper Patient Patient Electronic Signature(s) Signed: 06/24/2021 2:12:12 PM By: Lorrin Jackson Entered By: Lorrin Jackson on 06/24/2021 14:12:12 -------------------------------------------------------------------------------- Lower Extremity Assessment Details Patient Name: Date of Service: [name], [name] 06/24/2021 1:00 PM Medical Record Number: [id number] Patient Account Number: [id number] Date of Birth/Sex: Treating RN: [birth year] (75 y.o. [name] Primary Care Truett Mcfarlan: Kelton Pillar Other Clinician: Referring Allister Lessley: Treating Daquon Greenleaf/Extender: Lesle Chris in Treatment: 5 Edema Assessment Assessed: Shirlyn Goltz: No] [Right: Yes] Edema: [Left: Ye] [Right: s] Calf Left: Right: Point of Measurement: 27 cm From Medial Instep 35 cm Ankle Left: Right: Point of Measurement: 9 cm From Medial Instep 25 cm Vascular  Assessment Pulses: Dorsalis Pedis Palpable: [Right:Yes] Electronic Signature(s) Signed: 06/24/2021 5:30:58 PM By: Lorrin Jackson Entered By: Lorrin Jackson on 06/24/2021 13:12:05 -------------------------------------------------------------------------------- Multi Wound Chart Details Patient Name: Date of Service: [name], [name] 06/24/2021 1:00 PM Medical Record Number: [id number] Patient Account Number: [id number] Date of Birth/Sex: Treating RN: [birth year] (75 y.o. Tonita Phoenix, Lauren Primary Care Hameed Kolar: Kelton Pillar Other Clinician: Referring Milfred Krammes: Treating Delan Ksiazek/Extender: Lesle Chris in Treatment: 5 Vital Signs Height(in): 66 Pulse(bpm): 66 Weight(lbs): 205 Blood Pressure(mmHg): 113/74 Body Mass Index(BMI): 33 Temperature(F): 98.1 Respiratory Rate(breaths/min): 16 Photos: [1:No Photos  Right, Lateral Lower Leg] [N/A:N/A N/A] Wound Location: [1:Trauma] [N/A:N/A] Wounding Event: [1:Venous Leg Ulcer] [N/A:N/A] Primary Etiology: [1:Osteoarthritis] [N/A:N/A] Comorbid History: [1:04/28/2021] [N/A:N/A] Date Acquired: [1:5] [N/A:N/A] Weeks of Treatment: [1:Open] [N/A:N/A] Wound Status: [1:1x0.4x0.1] [N/A:N/A] Measurements L x W x D (cm) [1:0.314] [N/A:N/A] A (cm) : rea [1:0.031] [N/A:N/A] Volume (cm) : [1:95.20%] [N/A:N/A] % Reduction in A rea: [1:97.60%] [N/A:N/A] % Reduction in Volume: [1:Full Thickness Without Exposed] [N/A:N/A] Classification: [1:Support Structures Medium] [N/A:N/A] Exudate Amount: [1:Serosanguineous] [N/A:N/A] Exudate Type: [1:red, brown] [N/A:N/A] Exudate Color: [1:Distinct, outline attached] [N/A:N/A] Wound Margin: [1:Large (67-100%)] [N/A:N/A] Granulation Amount: [1:Red] [N/A:N/A] Granulation Quality: [1:None Present (0%)] [N/A:N/A] Necrotic Amount: [1:Fat Layer (Subcutaneous Tissue): Yes N/A] Exposed Structures: [1:Fascia: No Tendon: No Muscle: No Joint: No Bone: No Medium (34-66%)]  [N/A:N/A] Epithelialization: [1:Compression Therapy] [N/A:N/A] Treatment Notes Electronic Signature(s) Signed: 06/24/2021 5:25:56 PM By: Linton Ham MD Signed: 06/25/2021 6:12:45 PM By: Rhae Hammock RN Entered By: Linton Ham on 06/24/2021 13:52:19 -------------------------------------------------------------------------------- Multi-Disciplinary Care Plan Details Patient Name: Date of Service: Jillene Bucks, [name] 06/24/2021 1:00 PM Medical Record Number: [id number] Patient Account Number: [id number] Date of Birth/Sex: Treating RN: [birth year] (75 y.o. Tonita Phoenix, Lauren Primary Care Chivas Notz: Kelton Pillar Other Clinician: Referring Kyandre Okray: Treating Lynzy Rawles/Extender: Lesle Chris in Treatment: 5 Multidisciplinary Care Plan reviewed with physician Active Inactive Venous Leg Ulcer Nursing Diagnoses: Knowledge deficit related to disease process and management Goals: Patient will maintain optimal edema control Date Initiated: 05/19/2021 Target Resolution Date: 06/20/2021 Goal Status: Active Patient/caregiver will verbalize understanding of disease process and disease management Date Initiated: 05/19/2021 Target Resolution Date: 06/20/2021 Goal Status: Active Interventions: Assess peripheral edema status every visit. Compression as ordered Provide education on venous insufficiency Notes: Wound/Skin Impairment Nursing Diagnoses: Impaired tissue integrity Knowledge deficit related to ulceration/compromised skin integrity Goals: Patient/caregiver will verbalize understanding of skin care regimen Date Initiated: 05/19/2021 Target Resolution Date: 06/20/2021 Goal Status: Active Ulcer/skin breakdown will have a volume reduction of 30% by week 4 Date Initiated: 05/19/2021 Target Resolution Date: 06/20/2021 Goal Status: Active Interventions: Assess patient/caregiver ability to obtain necessary supplies Assess patient/caregiver ability to perform  ulcer/skin care regimen upon admission and as needed Assess ulceration(s) every visit Provide education on ulcer and skin care Notes: Electronic Signature(s) Signed: 06/25/2021 6:12:45 PM By: Rhae Hammock RN Entered By: Rhae Hammock on 06/24/2021 13:43:42 -------------------------------------------------------------------------------- Pain Assessment Details Patient Name: Date of Service: [name], [place] W. 06/24/2021 1:00 PM Medical Record Number: [id number] Patient Account Number: [id number] Date of Birth/Sex: Treating RN: 10-27-1946 (75 y.o. [name] Primary Care Jibri Schriefer: Kelton Pillar Other Clinician: Referring Mckenley Birenbaum: Treating Beonka Amesquita/Extender: Lesle Chris in Treatment: 5 Active Problems Location of Pain Severity and Description of Pain Patient Has Paino No Site Locations Pain Management and Medication Current Pain Management: Electronic Signature(s) Signed: 06/24/2021 5:30:58 PM By: Lorrin Jackson Entered By: Lorrin Jackson on 06/24/2021 13:11:44 -------------------------------------------------------------------------------- Patient/Caregiver Education Details Patient Name: Date of Service: [name], [name] 7/12/2022andnbsp1:00 PM Medical Record Number: [id number] Patient Account Number: [id number] Date of Birth/Gender: Treating RN: 1946/07/17 (75 y.o. Tonita Phoenix, Lauren Primary Care Physician: Kelton Pillar Other Clinician: Referring Physician: Treating Physician/Extender: Lesle Chris in Treatment: 5 Education Assessment Education Provided To: Patient Education Topics Provided Venous: Methods: Explain/Verbal Responses: State content correctly Wound/Skin Impairment: Methods: Explain/Verbal Responses: State content correctly Electronic Signature(s) Signed: 06/25/2021 6:12:45 PM By: Rhae Hammock RN Entered By: Rhae Hammock on 06/24/2021  13:41:48 -------------------------------------------------------------------------------- Wound Assessment Details Patient Name: Date of  Service: [name], [name] 06/24/2021 1:00 PM Medical Record Number: [id number] Patient Account Number: [id number] Date of Birth/Sex: Treating RN: 08-25-1946 (75 y.o. [name] Primary Care Kaitlan Bin: Kelton Pillar Other Clinician: Referring Sarahgrace Broman: Treating Jessic Standifer/Extender: Lesle Chris in Treatment: 5 Wound Status Wound Number: 1 Primary Etiology: Venous Leg Ulcer Wound Location: Right, Lateral Lower Leg Wound Status: Open Wounding Event: Trauma Comorbid History: Osteoarthritis Date Acquired: 04/28/2021 Weeks Of Treatment: 5 Clustered Wound: No Photos Wound Measurements Length: (cm) 1 Width: (cm) 0.4 Depth: (cm) 0.1 Area: (cm) 0.314 Volume: (cm) 0.031 % Reduction in Area: 95.2% % Reduction in Volume: 97.6% Epithelialization: Medium (34-66%) Tunneling: No Undermining: No Wound Description Classification: Full Thickness Without Exposed Support Structures Wound Margin: Distinct, outline attached Exudate Amount: Medium Exudate Type: Serosanguineous Exudate Color: red, brown Foul Odor After Cleansing: No Slough/Fibrino No Wound Bed Granulation Amount: Large (67-100%) Exposed Structure Granulation Quality: Red Fascia Exposed: No Necrotic Amount: None Present (0%) Fat Layer (Subcutaneous Tissue) Exposed: Yes Tendon Exposed: No Muscle Exposed: No Joint Exposed: No Bone Exposed: No Treatment Notes Wound #1 (Lower Leg) Wound Laterality: Right, Lateral Cleanser Soap and Water Discharge Instruction: May shower and wash wound with dial antibacterial soap and water prior to dressing change. Peri-Wound Care Sween Lotion (Moisturizing lotion) Discharge Instruction: Apply moisturizing lotion as directed Topical Primary Dressing Hydrofera Blue Classic Foam, 2x2 in Discharge Instruction:  Moisten with saline prior to applying to wound bed Secondary Dressing Woven Gauze Sponge, Non-Sterile 4x4 in Discharge Instruction: Apply over primary dressing as directed. ABD Pad, 8x10 Discharge Instruction: Apply over primary dressing as directed. Secured With Compression Wrap ThreePress (3 layer compression wrap) Discharge Instruction: Apply three layer compression as directed. Compression Stockings Add-Ons Electronic Signature(s) Signed: 06/24/2021 5:30:58 PM By: Lorrin Jackson Signed: 06/30/2021 3:49:49 PM By: Sandre Kitty Entered By: Sandre Kitty on 06/24/2021 16:21:06 -------------------------------------------------------------------------------- Vitals Details Patient Name: Date of Service: [name], [place] W. 06/24/2021 1:00 PM Medical Record Number: [id number] Patient Account Number: [id number] Date of Birth/Sex: Treating RN: Apr 08, 1946 (75 y.o. [name] Primary Care Jermiah Soderman: Kelton Pillar Other Clinician: Referring Bari Leib: Treating Ysmael Hires/Extender: Lesle Chris in Treatment: 5 Vital Signs Time Taken: 13:07 Temperature (F): 98.1 Height (in): 66 Pulse (bpm): 66 Weight (lbs): 205 Respiratory Rate (breaths/min): 16 Body Mass Index (BMI): 33.1 Blood Pressure (mmHg): 113/74 Reference Range: 80 - 120 mg / dl Electronic Signature(s) Signed: 06/24/2021 5:30:58 PM By: Lorrin Jackson Entered By: Lorrin Jackson on 06/24/2021 13:07:48

## 2021-07-01 ENCOUNTER — Encounter (HOSPITAL_BASED_OUTPATIENT_CLINIC_OR_DEPARTMENT_OTHER): Payer: Medicare Other | Admitting: Internal Medicine

## 2021-07-01 ENCOUNTER — Other Ambulatory Visit: Payer: Self-pay

## 2021-07-01 DIAGNOSIS — L97818 Non-pressure chronic ulcer of other part of right lower leg with other specified severity: Secondary | ICD-10-CM | POA: Diagnosis not present

## 2021-07-01 DIAGNOSIS — I890 Lymphedema, not elsewhere classified: Secondary | ICD-10-CM | POA: Diagnosis not present

## 2021-07-01 DIAGNOSIS — S8011XA Contusion of right lower leg, initial encounter: Secondary | ICD-10-CM | POA: Diagnosis not present

## 2021-07-01 NOTE — Progress Notes (Signed)
Abigail Guzman, Abigail Guzman ([id number]) Visit Report for 07/01/2021 HPI Details Patient Name: Date of Service: [name], [place] NDRA W. 07/01/2021 1:45 PM Medical Record Number: [id number] Patient Account Number: [id number] Date of Birth/Sex: Treating RN: 1946-03-22 (75 y.o. Tonita Phoenix, Lauren Primary Care Provider: Kelton Pillar Other Clinician: Referring Provider: Treating Provider/Extender: Lesle Chris in Treatment: 6 History of Present Illness HPI Description: ADMISSION 05/19/2021 This is a fairly healthy 75 year old woman who suffered a traumatic contusion to her right lateral lower leg about 3 weeks ago when her car door hit her leg in her garage. Apparently bled profusely. He saw primary care who referred her to dermatology. She was advised to "clean this aggressively" and was initially applying Neosporin more recently Vaseline. We do not have any of these notes. She was able to give [place] a pictorial history on her phone. Initially a fairly eschared surface. I am not sure if there is a hematoma underneath this but she has been scrubbing at this to remove all the debris from the surface she did a fairly good job. She also reports weeping edema fluid coming out of the surface of the wound. She does not have a wound history. She is not a diabetic Past medical history includes lumbar radiculopathy, hypertension, bradycardia, gastroesophageal reflux disease, irritable bowel syndrome ABIs in our clinic were 1.02 on the right and 1.04 in the left 6/13; patient suffered a car door contusion of the right lower leg. Completely nonviable surface last time which required debridement. We have been using Hydrofera Blue under compression. 6/20; better looking wound surface still using Hydrofera Blue under compression dimensions are smaller 6/28; slightly smaller no depth wound looks healthy. Using Hydrofera Blue under compression 7/5; wound is smaller healthy looking using Hydrofera  Blue under compression 7/12; substantial improvement in surface area only 2 small areas remain. Using Hydrofera Blue under compression 7/19; the area on the right lateral calf which was initially traumatic is totally healed Electronic Signature(s) Signed: 07/01/2021 5:24:30 PM By: Linton Ham MD Entered By: Linton Ham on 07/01/2021 15:04:55 -------------------------------------------------------------------------------- Physical Exam Details Patient Name: Date of Service: [name], [name] 07/01/2021 1:45 PM Medical Record Number: [id number] Patient Account Number: [id number] Date of Birth/Sex: Treating RN: [birth year] (75 y.o. Tonita Phoenix, Lauren Primary Care Provider: Kelton Pillar Other Clinician: Referring Provider: Treating Provider/Extender: Lesle Chris in Treatment: 6 Constitutional Sitting or standing Blood Pressure is within target range for patient.. Pulse regular and within target range for patient.Marland Kitchen Respirations regular, non-labored and within target range.. Temperature is normal and within the target range for the patient.Marland Kitchen Appears in no distress. Notes Wound exam right lateral lower leg this is fully epithelialized. She has decent edema control probably some degree of chronic venous insufficiency Electronic Signature(s) Signed: 07/01/2021 5:24:30 PM By: Linton Ham MD Entered By: Linton Ham on 07/01/2021 15:05:30 -------------------------------------------------------------------------------- Physician Orders Details Patient Name: Date of Service: HA MILTO Delane Ginger, [name] 07/01/2021 1:45 PM Medical Record Number: [id number] Patient Account Number: [id number] Date of Birth/Sex: Treating RN: 1946-01-16 (75 y.o. Tonita Phoenix, Lauren Primary Care Provider: Kelton Pillar Other Clinician: Referring Provider: Treating Provider/Extender: Lesle Chris in Treatment: 6 Verbal / Phone Orders:  No Diagnosis Coding Discharge From [facility] Services Discharge from [place] Edema Control - Lymphedema / SCD / Other Elevate legs to the level of the heart or above for 30 minutes daily and/or when sitting, a frequency of: Avoid standing for long periods of  time. Patient to wear own compression stockings every day. Electronic Signature(s) Signed: 07/01/2021 5:24:30 PM By: Linton Ham MD Signed: 07/01/2021 5:32:12 PM By: Rhae Hammock RN Entered By: Rhae Hammock on 07/01/2021 14:54:30 -------------------------------------------------------------------------------- Problem List Details Patient Name: Date of Service: [name], [name] 07/01/2021 1:45 PM Medical Record Number: [id number] Patient Account Number: [id number] Date of Birth/Sex: Treating RN: Jun 01, 1946 (75 y.o. Tonita Phoenix, Lauren Primary Care Provider: Kelton Pillar Other Clinician: Referring Provider: Treating Provider/Extender: Lesle Chris in Treatment: 6 Active Problems ICD-10 Encounter Code Description Active Date MDM Diagnosis S80.11XD Contusion of right lower leg, subsequent encounter 05/19/2021 No Yes L97.818 Non-pressure chronic ulcer of other part of right lower leg with other specified 05/19/2021 No Yes severity Inactive Problems Resolved Problems Electronic Signature(s) Signed: 07/01/2021 5:24:30 PM By: Linton Ham MD Entered By: Linton Ham on 07/01/2021 15:03:04 -------------------------------------------------------------------------------- Progress Note Details Patient Name: Date of Service: Abigail Guzman, [name] 07/01/2021 1:45 PM Medical Record Number: [id number] Patient Account Number: [id number] Date of Birth/Sex: Treating RN: Nov 19, 1946 (75 y.o. Tonita Phoenix, Lauren Primary Care Provider: Kelton Pillar Other Clinician: Referring Provider: Treating Provider/Extender: Lesle Chris in Treatment:  6 Subjective History of Present Illness (HPI) ADMISSION 05/19/2021 This is a fairly healthy 75 year old woman who suffered a traumatic contusion to her right lateral lower leg about 3 weeks ago when her car door hit her leg in her garage. Apparently bled profusely. He saw primary care who referred her to dermatology. She was advised to "clean this aggressively" and was initially applying Neosporin more recently Vaseline. We do not have any of these notes. She was able to give [place] a pictorial history on her phone. Initially a fairly eschared surface. I am not sure if there is a hematoma underneath this but she has been scrubbing at this to remove all the debris from the surface she did a fairly good job. She also reports weeping edema fluid coming out of the surface of the wound. She does not have a wound history. She is not a diabetic Past medical history includes lumbar radiculopathy, hypertension, bradycardia, gastroesophageal reflux disease, irritable bowel syndrome ABIs in our clinic were 1.02 on the right and 1.04 in the left 6/13; patient suffered a car door contusion of the right lower leg. Completely nonviable surface last time which required debridement. We have been using Hydrofera Blue under compression. 6/20; better looking wound surface still using Hydrofera Blue under compression dimensions are smaller 6/28; slightly smaller no depth wound looks healthy. Using Hydrofera Blue under compression 7/5; wound is smaller healthy looking using Hydrofera Blue under compression 7/12; substantial improvement in surface area only 2 small areas remain. Using Hydrofera Blue under compression 7/19; the area on the right lateral calf which was initially traumatic is totally healed Objective Constitutional Sitting or standing Blood Pressure is within target range for patient.. Pulse regular and within target range for patient.Marland Kitchen Respirations regular, non-labored and within target range.. Temperature  is normal and within the target range for the patient.Marland Kitchen Appears in no distress. Vitals Time Taken: 2:30 PM, Height: 66 in, Weight: 205 lbs, BMI: 33.1, Temperature: 98.4 F, Pulse: 70 bpm, Respiratory Rate: 18 breaths/min, Blood Pressure: 114/76 mmHg. General Notes: Wound exam right lateral lower leg this is fully epithelialized. She has decent edema control probably some degree of chronic venous insufficiency Integumentary (Hair, Skin) Wound #1 status is Healed - Epithelialized. Original cause of wound was Trauma. The date acquired was: 04/28/2021. The wound  has been in treatment 6 weeks. The wound is located on the Right,Lateral Lower Leg. The wound measures 0cm length x 0cm width x 0cm depth; 0cm^2 area and 0cm^3 volume. Assessment Active Problems ICD-10 Contusion of right lower leg, subsequent encounter Non-pressure chronic ulcer of other part of right lower leg with other specified severity Plan Discharge From [facility] Services: Discharge from [place] Edema Control - Lymphedema / SCD / Other: Elevate legs to the level of the heart or above for 30 minutes daily and/or when sitting, a frequency of: Avoid standing for long periods of time. Patient to wear own compression stockings every day. 1. The patient be discharged from the clinic 2. We had some conversation about support stockings which she apparently has at home. I do not think she is to convinced of the need however. Electronic Signature(s) Signed: 07/01/2021 5:24:30 PM By: Linton Ham MD Entered By: Linton Ham on 07/01/2021 15:06:03 -------------------------------------------------------------------------------- SuperBill Details Patient Name: Date of Service: Abigail Guzman, [name] 07/01/2021 Medical Record Number: [id number] Patient Account Number: [id number] Date of Birth/Sex: Treating RN: Mar 30, 1946 (75 y.o. Tonita Phoenix, Lauren Primary Care Provider: Kelton Pillar Other Clinician: Referring  Provider: Treating Provider/Extender: Lesle Chris in Treatment: 6 Diagnosis Coding ICD-10 Codes Code Description S80.11XD Contusion of right lower leg, subsequent encounter L97.818 Non-pressure chronic ulcer of other part of right lower leg with other specified severity Facility Procedures CPT4 Code: [id number] Description: 99213 - WOUND CARE VISIT-LEV 3 EST PT Modifier: Quantity: 1 Physician Procedures : CPT4 Code Description Modifier [id number] - WC PHYS LEVEL 2 - EST PT ICD-10 Diagnosis Description S80.11XD Contusion of right lower leg, subsequent encounter L97.818 Non-pressure chronic ulcer of other part of right lower leg with other specified  severity Quantity: 1 Electronic Signature(s) Signed: 07/01/2021 5:24:30 PM By: Linton Ham MD Entered By: Linton Ham on 07/01/2021 15:06:18

## 2021-07-02 NOTE — Progress Notes (Addendum)
Guzman, Abigail ([id number]) Visit Report for 07/01/2021 Arrival Information Details Patient Name: Date of Service: Abigail Guzman NDRA W. 07/01/2021 1:45 PM Medical Record Number: [id number] Patient Account Number: [id number] Date of Birth/Sex: Treating RN: [birth year] (75 y.o. Abigail Guzman Primary Care Jedrick Hutcherson: Kelton Pillar Other Clinician: Referring Jerome Otter: Treating Elspeth Blucher/Extender: Lesle Chris in Treatment: 6 Visit Information History Since Last Visit Added or deleted any medications: No Patient Arrived: Ambulatory Any new allergies or adverse reactions: No Arrival Time: 14:32 Had a fall or experienced change in No Transfer Assistance: None activities of daily living that may affect Patient Identification Verified: Yes risk of falls: Secondary Verification Process Completed: Yes Signs or symptoms of abuse/neglect since last visito No Patient Requires Transmission-Based Precautions: No Implantable device outside of the clinic excluding No Patient Has Alerts: No cellular tissue based products placed in the center since last visit: Has Dressing in Place as Prescribed: Yes Has Compression in Place as Prescribed: Yes Pain Present Now: No Electronic Signature(s) Signed: 07/01/2021 2:32:44 PM By: Lorrin Jackson Entered By: Lorrin Jackson on 07/01/2021 14:32:44 -------------------------------------------------------------------------------- Clinic Level of Care Assessment Details Patient Name: Date of Service: HA Abigail Guzman NDRA W. 07/01/2021 1:45 PM Medical Record Number: [id number] Patient Account Number: [id number] Date of Birth/Sex: Treating RN: 28-Jun-1946 (75 y.o. Tonita Phoenix, Lauren Primary Care Hagen Bohorquez: Kelton Pillar Other Clinician: Referring Gracianna Vink: Treating Moya Duan/Extender: Lesle Chris in Treatment: 6 Clinic Level of Care Assessment Items TOOL 4 Quantity Score X- 1 0 Use when only an EandM  is performed on FOLLOW-UP visit ASSESSMENTS - Nursing Assessment / Reassessment X- 1 10 Reassessment of Co-morbidities (includes updates in patient status) X- 1 5 Reassessment of Adherence to Treatment Plan ASSESSMENTS - Wound and Skin A ssessment / Reassessment X - Simple Wound Assessment / Reassessment - one wound 1 5 []  - 0 Complex Wound Assessment / Reassessment - multiple wounds X- 1 10 Dermatologic / Skin Assessment (not related to wound area) ASSESSMENTS - Focused Assessment X- 1 5 Circumferential Edema Measurements - multi extremities []  - 0 Nutritional Assessment / Counseling / Intervention []  - 0 Lower Extremity Assessment (monofilament, tuning fork, pulses) []  - 0 Peripheral Arterial Disease Assessment (using hand held doppler) ASSESSMENTS - Ostomy and/or Continence Assessment and Care []  - 0 Incontinence Assessment and Management []  - 0 Ostomy Care Assessment and Management (repouching, etc.) PROCESS - Coordination of Care X - Simple Patient / Family Education for ongoing care 1 15 []  - 0 Complex (extensive) Patient / Family Education for ongoing care X- 1 10 Staff obtains Programmer, systems, Records, T Results / Process Orders est []  - 0 Staff telephones HHA, Nursing Homes / Clarify orders / etc []  - 0 Routine Transfer to another Facility (non-emergent condition) []  - 0 Routine Hospital Admission (non-emergent condition) []  - 0 New Admissions / Biomedical engineer / Ordering NPWT Apligraf, etc. , []  - 0 Emergency Hospital Admission (emergent condition) X- 1 10 Simple Discharge Coordination []  - 0 Complex (extensive) Discharge Coordination PROCESS - Special Needs []  - 0 Pediatric / Minor Patient Management []  - 0 Isolation Patient Management []  - 0 Hearing / Language / Visual special needs []  - 0 Assessment of Community assistance (transportation, D/C planning, etc.) []  - 0 Additional assistance / Altered mentation []  - 0 Support Surface(s)  Assessment (bed, cushion, seat, etc.) INTERVENTIONS - Wound Cleansing / Measurement X - Simple Wound Cleansing - one wound 1 5 []  - 0 Complex Wound Cleansing - multiple  wounds X- 1 5 Wound Imaging (photographs - any number of wounds) []  - 0 Wound Tracing (instead of photographs) X- 1 5 Simple Wound Measurement - one wound []  - 0 Complex Wound Measurement - multiple wounds INTERVENTIONS - Wound Dressings []  - 0 Small Wound Dressing one or multiple wounds []  - 0 Medium Wound Dressing one or multiple wounds []  - 0 Large Wound Dressing one or multiple wounds []  - 0 Application of Medications - topical []  - 0 Application of Medications - injection INTERVENTIONS - Miscellaneous []  - 0 External ear exam []  - 0 Specimen Collection (cultures, biopsies, blood, body fluids, etc.) []  - 0 Specimen(s) / Culture(s) sent or taken to Lab for analysis []  - 0 Patient Transfer (multiple staff / Civil Service fast streamer / Similar devices) []  - 0 Simple Staple / Suture removal (25 or less) []  - 0 Complex Staple / Suture removal (26 or more) []  - 0 Hypo / Hyperglycemic Management (close monitor of Blood Glucose) []  - 0 Ankle / Brachial Index (ABI) - do not check if billed separately X- 1 5 Vital Signs Has the patient been seen at the hospital within the last three years: Yes Total Score: 90 Level Of Care: New/Established - Level 3 Electronic Signature(s) Signed: 07/01/2021 5:32:12 PM By: Rhae Hammock RN Entered By: Rhae Hammock on 07/01/2021 14:55:26 -------------------------------------------------------------------------------- Encounter Discharge Information Details Patient Name: Date of Service: HA Abigail Guzman, [name] 07/01/2021 1:45 PM Medical Record Number: [id number] Patient Account Number: [id number] Date of Birth/Sex: Treating RN: 12-06-1946 (75 y.o. Abigail Guzman Primary Care Carma Dwiggins: Kelton Pillar Other Clinician: Referring Bernadett Milian: Treating Tresten Pantoja/Extender: Lesle Chris in Treatment: 6 Encounter Discharge Information Items Discharge Condition: Stable Ambulatory Status: Ambulatory Discharge Destination: Home Transportation: Private Auto Schedule Follow-up Appointment: No Clinical Summary of Care: Provided on 07/01/2021 Form Type Recipient Paper Patient Patient Electronic Signature(s) Signed: 07/01/2021 4:05:08 PM By: Lorrin Jackson Entered By: Lorrin Jackson on 07/01/2021 16:05:07 -------------------------------------------------------------------------------- Lower Extremity Assessment Details Patient Name: Date of Service: HA Abigail Guzman, [name] 07/01/2021 1:45 PM Medical Record Number: [id number] Patient Account Number: [id number] Date of Birth/Sex: Treating RN: Dec 29, 1945 (75 y.o. Abigail Guzman Primary Care Alegra Rost: Kelton Pillar Other Clinician: Referring Larayah Clute: Treating Courney Garrod/Extender: Lesle Chris in Treatment: 6 Edema Assessment Assessed: Shirlyn Goltz: No] [Right: Yes] Edema: [Left: N] [Right: o] Calf Left: Right: Point of Measurement: 27 cm From Medial Instep 34.3 cm Ankle Left: Right: Point of Measurement: 9 cm From Medial Instep 24 cm Vascular Assessment Pulses: Dorsalis Pedis Palpable: [Right:Yes] Electronic Signature(s) Signed: 07/01/2021 4:04:27 PM By: Lorrin Jackson Entered By: Lorrin Jackson on 07/01/2021 16:04:27 -------------------------------------------------------------------------------- Multi-Disciplinary Care Plan Details Patient Name: Date of Service: HA Abigail Guzman, [name] 07/01/2021 1:45 PM Medical Record Number: [id number] Patient Account Number: [id number] Date of Birth/Sex: Treating RN: [birth year] (75 y.o. Tonita Phoenix, Lauren Primary Care Nissan Frazzini: Kelton Pillar Other Clinician: Referring Katya Rolston: Treating Rital Cavey/Extender: Lesle Chris in Treatment: 6 Multidisciplinary Care Plan reviewed with physician Active  Inactive Electronic Signature(s) Signed: 07/01/2021 5:32:12 PM By: Rhae Hammock RN Entered By: Rhae Hammock on 07/01/2021 14:54:39 -------------------------------------------------------------------------------- Pain Assessment Details Patient Name: Date of Service: HA Abigail Guzman, [name] 07/01/2021 1:45 PM Medical Record Number: [id number] Patient Account Number: [id number] Date of Birth/Sex: Treating RN: 04/22/1946 (75 y.o. Abigail Guzman Primary Care Rayn Enderson: Kelton Pillar Other Clinician: Referring Clif Serio: Treating Kyrielle Urbanski/Extender: Lesle Chris in Treatment: 6 Active Problems Location of Pain Severity and  Description of Pain Patient Has Paino No Site Locations Pain Management and Medication Current Pain Management: Electronic Signature(s) Signed: 07/01/2021 4:04:06 PM By: Lorrin Jackson Entered By: Lorrin Jackson on 07/01/2021 16:04:05 -------------------------------------------------------------------------------- Patient/Caregiver Education Details Patient Name: Date of Service: HA Abigail Guzman, [name] 7/19/2022andnbsp1:45 PM Medical Record Number: [id number] Patient Account Number: [id number] Date of Birth/Gender: Treating RN: 22-Sep-1946 (75 y.o. Tonita Phoenix, Lauren Primary Care Physician: Kelton Pillar Other Clinician: Referring Physician: Treating Physician/Extender: Lesle Chris in Treatment: 6 Education Assessment Education Provided To: Patient Education Topics Provided Wound/Skin Impairment: Methods: Explain/Verbal Responses: State content correctly Electronic Signature(s) Signed: 07/01/2021 5:32:12 PM By: Rhae Hammock RN Entered By: Rhae Hammock on 07/01/2021 14:54:52 -------------------------------------------------------------------------------- Wound Assessment Details Patient Name: Date of Service: HA Abigail Guzman, [name] 07/01/2021 1:45 PM Medical Record Number:  [id number] Patient Account Number: [id number] Date of Birth/Sex: Treating RN: 08-Apr-1946 (75 y.o. Abigail Guzman Primary Care Terrilyn Tyner: Kelton Pillar Other Clinician: Referring Isaah Furry: Treating Izzy Doubek/Extender: Lesle Chris in Treatment: 6 Wound Status Wound Number: 1 Primary Etiology: Venous Leg Ulcer Wound Location: Right, Lateral Lower Leg Wound Status: Healed - Epithelialized Wounding Event: Trauma Comorbid History: Osteoarthritis Date Acquired: 04/28/2021 Weeks Of Treatment: 6 Clustered Wound: No Wound Measurements Length: (cm) Width: (cm) Depth: (cm) Area: (cm) Volume: (cm) 0 % Reduction in Area: 100% 0 % Reduction in Volume: 100% 0 0 0 Wound Description Classification: Full Thickness Without Exposed Support Structur es Electronic Signature(s) Signed: 07/01/2021 2:33:25 PM By: Lorrin Jackson Entered By: Lorrin Jackson on 07/01/2021 14:33:24 -------------------------------------------------------------------------------- Vitals Details Patient Name: Date of Service: HA Abigail Guzman, [name] 07/01/2021 1:45 PM Medical Record Number: [id number] Patient Account Number: [id number] Date of Birth/Sex: Treating RN: [birth year] (75 y.o. Abigail Guzman Primary Care Roxy Mastandrea: Kelton Pillar Other Clinician: Referring Kanaan Kagawa: Treating Christal Lagerstrom/Extender: Lesle Chris in Treatment: 6 Vital Signs Time Taken: 14:30 Temperature (F): 98.4 Height (in): 66 Pulse (bpm): 70 Weight (lbs): 205 Respiratory Rate (breaths/min): 18 Body Mass Index (BMI): 33.1 Blood Pressure (mmHg): 114/76 Reference Range: 80 - 120 mg / dl Electronic Signature(s) Signed: 07/01/2021 2:33:04 PM By: Lorrin Jackson Entered By: Lorrin Jackson on 07/01/2021 14:33:04

## 2021-07-03 NOTE — Progress Notes (Signed)
[name], Guzman ([id number]) Visit Report for 06/10/2021 Arrival Information Details Patient Name: Date of Service: Abigail Guzman, Abigail NDRA W. 06/10/2021 9:00 A M Medical Record Number: [id number] Patient Account Number: [id number] Date of Birth/Sex: Treating RN: 1946/10/26 (75 y.o. Abigail Guzman, Meta.Reding Primary Care Shakala Marlatt: Kelton Pillar Other Clinician: Referring Ramonica Grigg: Treating Elgar Scoggins/Extender: Lesle Chris in Treatment: 3 Visit Information History Since Last Visit All ordered tests and consults were completed: No Patient Arrived: Ambulatory Added or deleted any medications: No Arrival Time: 09:25 Any new allergies or adverse reactions: No Accompanied By: alone Had a fall or experienced change in No Transfer Assistance: None activities of daily living that may affect Patient Identification Verified: Yes risk of falls: Secondary Verification Process Completed: Yes Signs or symptoms of abuse/neglect since last visito No Patient Requires Transmission-Based Precautions: No Hospitalized since last visit: No Patient Has Alerts: No Implantable device outside of the clinic excluding No cellular tissue based products placed in the center since last visit: Has Dressing in Place as Prescribed: Yes Has Compression in Place as Prescribed: Yes Pain Present Now: No Electronic Signature(s) Signed: 07/03/2021 9:18:14 PM By: Deon Pilling Previous Signature: 06/30/2021 8:53:22 PM Version By: Deon Pilling Entered By: Deon Pilling on 07/03/2021 21:15:15 -------------------------------------------------------------------------------- Compression Therapy Details Patient Name: Date of Service: HA Abigail Guzman, SA [place] W. 06/10/2021 9:00 A M Medical Record Number: [id number] Patient Account Number: [id number] Date of Birth/Sex: Treating RN: Sep 18, 1946 (75 y.o. Tonita Phoenix, Lauren Primary Care Sesar Madewell: Kelton Pillar Other Clinician: Referring Swara Donze: Treating  Teng Decou/Extender: Lesle Chris in Treatment: 3 Compression Therapy Performed for Wound Assessment: Wound #1 Right,Lateral Lower Leg Performed By: Clinician Abigail Hammock, RN Compression Type: Three Layer Post Procedure Diagnosis Same as Pre-procedure Electronic Signature(s) Signed: 06/10/2021 5:48:53 PM By: Abigail Hammock RN Entered By: Abigail Guzman on 06/10/2021 10:07:28 -------------------------------------------------------------------------------- Encounter Discharge Information Details Patient Name: Date of Service: HA Abigail Guzman, [name] 06/10/2021 9:00 A M Medical Record Number: [id number] Patient Account Number: [id number] Date of Birth/Sex: Treating RN: 1946-07-09 (75 y.o. Abigail Guzman, Abigail Guzman Primary Care Rayvon Dakin: Kelton Pillar Other Clinician: Referring Marget Outten: Treating Abigail Guzman/Extender: Lesle Chris in Treatment: 3 Encounter Discharge Information Items Discharge Condition: Stable Ambulatory Status: Ambulatory Discharge Destination: Home Transportation: Private Auto Accompanied By: alone Schedule Follow-up Appointment: No Clinical Summary of Care: Patient Declined Electronic Signature(s) Signed: 07/03/2021 9:18:14 PM By: Deon Pilling Previous Signature: 06/30/2021 8:53:22 PM Version By: Deon Pilling Entered By: Deon Pilling on 07/03/2021 21:18:02 -------------------------------------------------------------------------------- Lower Extremity Assessment Details Patient Name: Date of Service: HA Abigail Guzman, [name] 06/10/2021 9:00 A M Medical Record Number: [id number] Patient Account Number: [id number] Date of Birth/Sex: Treating RN: [birth year] (75 y.o. Abigail Guzman, Abigail Guzman Primary Care Abigail Guzman: Kelton Pillar Other Clinician: Referring Isidora Laham: Treating Roen Macgowan/Extender: Lesle Chris in Treatment: 3 Edema Assessment Assessed: Shirlyn Goltz: Yes] [Right: No] Edema: [Left: Ye]  [Right: s] Calf Left: Right: Point of Measurement: 27 cm From Medial Instep 38 cm Ankle Left: Right: Point of Measurement: 9 cm From Medial Instep 27.1 cm Vascular Assessment Pulses: Dorsalis Pedis Palpable: [Right:Yes] Electronic Signature(s) Signed: 07/03/2021 9:18:14 PM By: Deon Pilling Previous Signature: 06/30/2021 8:53:22 PM Version By: Deon Pilling Entered By: Deon Pilling on 07/03/2021 21:15:32 -------------------------------------------------------------------------------- Multi Wound Chart Details Patient Name: Date of Service: HA Abigail Guzman, [name] 06/10/2021 9:00 A M Medical Record Number: [id number] Patient Account Number: [id number] Date of Birth/Sex: Treating RN: [birth year] (75 y.o. Abigail Guzman Primary Care Illene Sweeting:  Kelton Pillar Other Clinician: Referring Dragon Thrush: Treating Donte Kary/Extender: Lesle Chris in Treatment: 3 Vital Signs Height(in): 66 Pulse(bpm): 59 Weight(lbs): 205 Blood Pressure(mmHg): 121/81 Body Mass Index(BMI): 33 Temperature(F): 98.1 Respiratory Rate(breaths/min): 16 Photos: [1:No Photos Right, Lateral Lower Leg] [N/A:N/A N/A] Wound Location: [1:Trauma] [N/A:N/A] Wounding Event: [1:Venous Leg Ulcer] [N/A:N/A] Primary Etiology: [1:Osteoarthritis] [N/A:N/A] Comorbid History: [1:04/28/2021] [N/A:N/A] Date Acquired: [1:3] [N/A:N/A] Weeks of Treatment: [1:Open] [N/A:N/A] Wound Status: [1:1.9x1.6x0.1] [N/A:N/A] Measurements L x W x D (cm) [1:2.388] [N/A:N/A] A (cm) : rea [1:0.239] [N/A:N/A] Volume (cm) : [1:63.70%] [N/A:N/A] % Reduction in A rea: [1:81.80%] [N/A:N/A] % Reduction in Volume: [1:Full Thickness Without Exposed] [N/A:N/A] Classification: [1:Support Structures Medium] [N/A:N/A] Exudate Amount: [1:Serosanguineous] [N/A:N/A] Exudate Type: [1:red, brown] [N/A:N/A] Exudate Color: [1:Distinct, outline attached] [N/A:N/A] Wound Margin: [1:Large (67-100%)] [N/A:N/A] Granulation Amount:  [1:Red] [N/A:N/A] Granulation Quality: [1:None Present (0%)] [N/A:N/A] Necrotic Amount: [1:Fat Layer (Subcutaneous Tissue): Yes N/A] Exposed Structures: [1:Fascia: No Tendon: No Muscle: No Joint: No Bone: No Medium (34-66%)] [N/A:N/A] Epithelialization: [1:Compression Therapy] [N/A:N/A] Treatment Notes Electronic Signature(s) Signed: 06/10/2021 5:29:17 PM By: Linton Ham MD Signed: 06/10/2021 5:48:53 PM By: Abigail Hammock RN Entered By: Linton Ham on 06/10/2021 10:35:57 -------------------------------------------------------------------------------- Multi-Disciplinary Care Plan Details Patient Name: Date of Service: Abigail Guzman, [name] 06/10/2021 9:00 A M Medical Record Number: [id number] Patient Account Number: [id number] Date of Birth/Sex: Treating RN: 1946-04-05 (75 y.o. Tonita Phoenix, Lauren Primary Care Fey Coghill: Kelton Pillar Other Clinician: Referring Brandon Wiechman: Treating Khalia Gong/Extender: Lesle Chris in Treatment: 3 Multidisciplinary Care Plan reviewed with physician Active Inactive Venous Leg Ulcer Nursing Diagnoses: Knowledge deficit related to disease process and management Goals: Patient will maintain optimal edema control Date Initiated: 05/19/2021 Target Resolution Date: 06/20/2021 Goal Status: Active Patient/caregiver will verbalize understanding of disease process and disease management Date Initiated: 05/19/2021 Target Resolution Date: 06/20/2021 Goal Status: Active Interventions: Assess peripheral edema status every visit. Compression as ordered Provide education on venous insufficiency Notes: Wound/Skin Impairment Nursing Diagnoses: Impaired tissue integrity Knowledge deficit related to ulceration/compromised skin integrity Goals: Patient/caregiver will verbalize understanding of skin care regimen Date Initiated: 05/19/2021 Target Resolution Date: 06/20/2021 Goal Status: Active Ulcer/skin breakdown will have a volume  reduction of 30% by week 4 Date Initiated: 05/19/2021 Target Resolution Date: 06/20/2021 Goal Status: Active Interventions: Assess patient/caregiver ability to obtain necessary supplies Assess patient/caregiver ability to perform ulcer/skin care regimen upon admission and as needed Assess ulceration(s) every visit Provide education on ulcer and skin care Notes: Electronic Signature(s) Signed: 06/10/2021 5:48:53 PM By: Abigail Hammock RN Entered By: Abigail Guzman on 06/10/2021 10:08:07 -------------------------------------------------------------------------------- Pain Assessment Details Patient Name: Date of Service: HA Abigail Guzman, [place] W. 06/10/2021 9:00 A M Medical Record Number: [id number] Patient Account Number: [id number] Date of Birth/Sex: Treating RN: 02/20/1946 (75 y.o. [name] Primary Care Tahiri Shareef: Kelton Pillar Other Clinician: Referring Stefano Trulson: Treating Hitoshi Werts/Extender: Lesle Chris in Treatment: 3 Active Problems Location of Pain Severity and Description of Pain Patient Has Paino No Site Locations Rate the pain. Current Pain Level: 0 Character of Pain Describe the Pain: Burning Pain Management and Medication Current Pain Management: Notes stings - not identified as pain Electronic Signature(s) Signed: 07/03/2021 9:18:14 PM By: Deon Pilling Previous Signature: 06/30/2021 8:53:22 PM Version By: Deon Pilling Entered By: Deon Pilling on 07/03/2021 21:15:27 -------------------------------------------------------------------------------- Patient/Caregiver Education Details Patient Name: Date of Service: HA Abigail Guzman, [name] 6/28/2022andnbsp9:00 A M Medical Record Number: [id number] Patient Account Number: [id number] Date of Birth/Gender: Treating RN: Apr 21, 1946 (75 y.o.  Tonita Phoenix, Lauren Primary Care Physician: Kelton Pillar Other Clinician: Referring Physician: Treating Physician/Extender: Lesle Chris in Treatment: 3 Education Assessment Education Provided To: Patient Education Topics Provided Venous: Methods: Explain/Verbal Responses: State content correctly Wound/Skin Impairment: Methods: Explain/Verbal Responses: State content correctly Electronic Signature(s) Signed: 06/10/2021 5:48:53 PM By: Abigail Hammock RN Entered By: Abigail Guzman on 06/10/2021 10:04:51 -------------------------------------------------------------------------------- Wound Assessment Details Patient Name: Date of Service: HA Abigail Guzman, [name] 06/10/2021 9:00 A M Medical Record Number: [id number] Patient Account Number: [id number] Date of Birth/Sex: Treating RN: Apr 29, 1946 (75 y.o. Abigail Guzman, Meta.Reding Primary Care Ladarious Kresse: Kelton Pillar Other Clinician: Referring Samba Cumba: Treating Frannie Shedrick/Extender: Lesle Chris in Treatment: 3 Wound Status Wound Number: 1 Primary Etiology: Venous Leg Ulcer Wound Location: Right, Lateral Lower Leg Wound Status: Open Wounding Event: Trauma Comorbid History: Osteoarthritis Date Acquired: 04/28/2021 Weeks Of Treatment: 3 Clustered Wound: No Photos Wound Measurements Length: (cm) 1.9 Width: (cm) 1.6 Depth: (cm) 0.1 Area: (cm) 2.388 Volume: (cm) 0.239 % Reduction in Area: 63.7% % Reduction in Volume: 81.8% Epithelialization: Medium (34-66%) Tunneling: No Undermining: No Wound Description Classification: Full Thickness Without Exposed Support Structures Wound Margin: Distinct, outline attached Exudate Amount: Medium Exudate Type: Serosanguineous Exudate Color: red, brown Foul Odor After Cleansing: No Slough/Fibrino No Wound Bed Granulation Amount: Large (67-100%) Exposed Structure Granulation Quality: Red Fascia Exposed: No Necrotic Amount: None Present (0%) Fat Layer (Subcutaneous Tissue) Exposed: Yes Tendon Exposed: No Muscle Exposed: No Joint Exposed: No Bone Exposed:  No Treatment Notes Wound #1 (Lower Leg) Wound Laterality: Right, Lateral Cleanser Soap and Water Discharge Instruction: May shower and wash wound with dial antibacterial soap and water prior to dressing change. Peri-Wound Care Sween Lotion (Moisturizing lotion) Discharge Instruction: Apply moisturizing lotion as directed Topical Primary Dressing Hydrofera Blue Classic Foam, 2x2 in Discharge Instruction: Moisten with saline prior to applying to wound bed Secondary Dressing Woven Gauze Sponge, Non-Sterile 4x4 in Discharge Instruction: Apply over primary dressing as directed. ABD Pad, 8x10 Discharge Instruction: Apply over primary dressing as directed. Secured With Compression Wrap ThreePress (3 layer compression wrap) Discharge Instruction: Apply three layer compression as directed. Compression Stockings Add-Ons Electronic Signature(s) Signed: 07/03/2021 9:40:46 PM By: Deon Pilling Previous Signature: 07/03/2021 9:18:14 PM Version By: Deon Pilling Previous Signature: 06/11/2021 10:48:43 AM Version By: Sandre Kitty Previous Signature: 06/30/2021 8:53:22 PM Version By: Deon Pilling Entered By: Deon Pilling on 07/03/2021 21:40:46 -------------------------------------------------------------------------------- Vitals Details Patient Name: Date of Service: HA Abigail Guzman, [name] 06/10/2021 9:00 A M Medical Record Number: [id number] Patient Account Number: [id number] Date of Birth/Sex: Treating RN: 08-05-1946 (75 y.o. Abigail Guzman, Abigail Guzman Primary Care Chetan Mehring: Kelton Pillar Other Clinician: Referring Mariska Daffin: Treating Tanetta Fuhriman/Extender: Lesle Chris in Treatment: 3 Vital Signs Time Taken: 09:26 Temperature (F): 98.1 Height (in): 66 Pulse (bpm): 59 Weight (lbs): 205 Respiratory Rate (breaths/min): 16 Body Mass Index (BMI): 33.1 Blood Pressure (mmHg): 121/81 Reference Range: 80 - 120 mg / dl Airway Pulse Oximetry (%): 95 Electronic  Signature(s) Signed: 07/03/2021 9:18:14 PM By: Deon Pilling Previous Signature: 06/30/2021 8:53:22 PM Version By: Deon Pilling Entered By: Deon Pilling on 07/03/2021 21:15:20

## 2021-07-08 DIAGNOSIS — M5416 Radiculopathy, lumbar region: Secondary | ICD-10-CM | POA: Diagnosis not present

## 2021-07-29 DIAGNOSIS — M5416 Radiculopathy, lumbar region: Secondary | ICD-10-CM | POA: Diagnosis not present

## 2021-09-04 DIAGNOSIS — Z23 Encounter for immunization: Secondary | ICD-10-CM | POA: Diagnosis not present

## 2021-09-18 ENCOUNTER — Other Ambulatory Visit: Payer: Self-pay | Admitting: Cardiovascular Disease

## 2021-09-18 DIAGNOSIS — Z6829 Body mass index (BMI) 29.0-29.9, adult: Secondary | ICD-10-CM | POA: Diagnosis not present

## 2021-09-18 DIAGNOSIS — Z01419 Encounter for gynecological examination (general) (routine) without abnormal findings: Secondary | ICD-10-CM | POA: Diagnosis not present

## 2021-10-13 DIAGNOSIS — M5416 Radiculopathy, lumbar region: Secondary | ICD-10-CM | POA: Diagnosis not present

## 2021-10-20 DIAGNOSIS — Z20822 Contact with and (suspected) exposure to covid-19: Secondary | ICD-10-CM | POA: Diagnosis not present

## 2021-11-04 DIAGNOSIS — L821 Other seborrheic keratosis: Secondary | ICD-10-CM | POA: Diagnosis not present

## 2021-11-04 DIAGNOSIS — L720 Epidermal cyst: Secondary | ICD-10-CM | POA: Diagnosis not present

## 2021-11-04 DIAGNOSIS — D225 Melanocytic nevi of trunk: Secondary | ICD-10-CM | POA: Diagnosis not present

## 2021-11-04 DIAGNOSIS — L718 Other rosacea: Secondary | ICD-10-CM | POA: Diagnosis not present

## 2021-11-04 DIAGNOSIS — L570 Actinic keratosis: Secondary | ICD-10-CM | POA: Diagnosis not present

## 2021-11-04 DIAGNOSIS — D692 Other nonthrombocytopenic purpura: Secondary | ICD-10-CM | POA: Diagnosis not present

## 2021-11-12 DIAGNOSIS — Z6832 Body mass index (BMI) 32.0-32.9, adult: Secondary | ICD-10-CM | POA: Diagnosis not present

## 2021-11-12 DIAGNOSIS — M461 Sacroiliitis, not elsewhere classified: Secondary | ICD-10-CM | POA: Diagnosis not present

## 2021-11-19 DIAGNOSIS — I129 Hypertensive chronic kidney disease with stage 1 through stage 4 chronic kidney disease, or unspecified chronic kidney disease: Secondary | ICD-10-CM | POA: Diagnosis not present

## 2021-11-19 DIAGNOSIS — N1831 Chronic kidney disease, stage 3a: Secondary | ICD-10-CM | POA: Diagnosis not present

## 2021-11-19 DIAGNOSIS — R7303 Prediabetes: Secondary | ICD-10-CM | POA: Diagnosis not present

## 2021-11-19 DIAGNOSIS — M545 Low back pain, unspecified: Secondary | ICD-10-CM | POA: Diagnosis not present

## 2021-11-19 DIAGNOSIS — Z0000 Encounter for general adult medical examination without abnormal findings: Secondary | ICD-10-CM | POA: Diagnosis not present

## 2021-11-19 DIAGNOSIS — K589 Irritable bowel syndrome without diarrhea: Secondary | ICD-10-CM | POA: Diagnosis not present

## 2021-11-19 DIAGNOSIS — I4510 Unspecified right bundle-branch block: Secondary | ICD-10-CM | POA: Diagnosis not present

## 2021-11-19 DIAGNOSIS — E669 Obesity, unspecified: Secondary | ICD-10-CM | POA: Diagnosis not present

## 2021-11-19 DIAGNOSIS — Z1389 Encounter for screening for other disorder: Secondary | ICD-10-CM | POA: Diagnosis not present

## 2021-11-19 DIAGNOSIS — K219 Gastro-esophageal reflux disease without esophagitis: Secondary | ICD-10-CM | POA: Diagnosis not present

## 2021-11-19 DIAGNOSIS — E7800 Pure hypercholesterolemia, unspecified: Secondary | ICD-10-CM | POA: Diagnosis not present

## 2021-11-19 DIAGNOSIS — N951 Menopausal and female climacteric states: Secondary | ICD-10-CM | POA: Diagnosis not present

## 2021-12-16 NOTE — Progress Notes (Signed)
Cardiology Office Note   Date:  12/29/2021   ID:  Abigail Guzman, Abigail Guzman February 19, 1946, MRN [id number]  PCP:  Kelton Pillar, MD  Cardiologist:   Jenkins Rouge, MD   No chief complaint on file.     History of Present Illness: Abigail Guzman is a 76 y.o. female seen in f/u for bradycardia. First seen April 2019  Referred by Dr Laurann Montana Reviewed her office note indicating some symptomatic dizziness. Patient has been on a diet and not clear if symptoms related to this or low HR. No chest pain or dyspnea Occasional palpitations   She has chronic RBBB F/U ETT 03/29/18 non ischemic and she reached peak HR 128 bpm  TTE 03/29/18 with normal EF 60-65% and only mild AR  Activity limited by back pain. Has seen ortho, chiropractor and PT with injections Now on Tylenol 3  Her ex unfortunately had a stroke in [place] and she has not been able to travel due to [place]  October 2021 had a vagal episode Had been gardening for 4 days and felt dizzy jumping up to answer Door bell no palpitations   Seeing wound center for traumatic RLE contusion June 2022 from car door   No cardiac complaints   Past Medical History:  Diagnosis Date   Arthritis    Bradycardia    GERD (gastroesophageal reflux disease)    Hiatal hernia    Hyperlipidemia    Joint pain    Left ovarian cyst 04/12/2017   Night sweats    Palpitation    Prediabetes     Past Surgical History:  Procedure Laterality Date   ABDOMINAL HYSTERECTOMY     COSMETIC SURGERY       Current Outpatient Medications  Medication Sig Dispense Refill   hydrocortisone (ANUSOL-HC) 2.5 % rectal cream Place 1 application rectally 2 (two) times daily.     hyoscyamine (LEVSIN) 0.125 MG/5ML ELIX Take 0.125 mg by mouth as directed.     meloxicam (MOBIC) 15 MG tablet meloxicam 15 mg tablet   1 tablet as needed by oral route.     omeprazole (PRILOSEC) 40 MG capsule Take 40 mg by mouth daily.     Probiotic Product (PROBIOTIC PO) Take by mouth as  directed.     Specialty Vitamins Products (COLLAGEN ULTRA PO) Take by mouth. TAKE AS DIRECTED     triamterene-hydrochlorothiazide (DYAZIDE) 37.5-25 MG capsule Take 1 each (1 capsule total) by mouth daily. Please keep upcoming appt in January 2023 with Dr. Johnsie Cancel before anymore refills. Thank you 90 capsule 0   TURMERIC PO Take by mouth as directed.     VITAMIN D, CHOLECALCIFEROL, PO Take 1 tablet by mouth daily.     rosuvastatin (CRESTOR) 10 MG tablet Take 10 mg by mouth every other day.     Current Facility-Administered Medications  Medication Dose Route Frequency Provider Last Rate Last Admin   cetirizine (ZYRTEC) tablet 10 mg  10 mg Oral Once Tereasa Coop, PA-C        Allergies:   Sulfa antibiotics    Social History:  The patient  reports that she quit smoking about 42 years ago. Her smoking use included cigarettes. She has a 10.00 pack-year smoking history. She has never used smokeless tobacco. She reports current alcohol use of about 2.0 standard drinks per week. She reports current drug use. Drug: Marijuana.   Family History:  The patient's family history includes Diabetes in her father; Heart disease in her father; Stroke in her  mother.    ROS:  Please see the history of present illness.   Otherwise, review of systems are positive for none.   All other systems are reviewed and negative.    PHYSICAL EXAM: VS:  BP 120/70    Pulse (!) 54    Ht 5\' 6"  (1.676 m)    Wt 173 lb (78.5 kg)    BMI 27.92 kg/m  , BMI Body mass index is 27.92 kg/m. Affect appropriate Healthy:  appears stated age 27: normal Neck supple with no adenopathy JVP normal no bruits no thyromegaly Lungs clear with no wheezing and good diaphragmatic motion Heart:  S1/S2 mild AR  murmur, no rub, gallop or click PMI normal Abdomen: benighn, BS positve, no tenderness, no AAA no bruit.  No HSM or HJR Distal pulses intact with no bruits No edema Neuro non-focal Mild bruising RLE improved  No muscular  weakness     EKG:  02/09/18 SR RBBB PR 150 msec PVC;s 03/17/18 SR RBBB low voltage rate 57  10/02/19 SR rate 63 LAD RBBB no changes 12/29/2021 SR rate 54 RBBB no acute changes    Recent Labs: No results found for requested labs within last 8760 hours.    Lipid Panel No results found for: CHOL, TRIG, HDL, CHOLHDL, VLDL, LDLCALC, LDLDIRECT    Wt Readings from Last 3 Encounters:  12/29/21 173 lb (78.5 kg)  10/03/20 210 lb 12.8 oz (95.6 kg)  10/02/19 212 lb (96.2 kg)      Other studies Reviewed: Additional studies/ records that were reviewed today include: Notes Dr Laurann Montana ECG and labs .ETT 03/29/18 And TTE 03/29/18    ASSESSMENT AND PLAN:  1. Bradycardia seems benign and not related to any symptoms RBBB also benign  Peak HR during ETT 03/29/18 acceptable at 125 bpm and HR today in 80's  stab;e   2. HTN:  Well controlled.  Continue current medications and low sodium Dash type diet.    3. HLD on statin labs with primary   4. Pre DM:  Discussed low carb diet   5. AR:  Mild by echo 03/29/18 with normal LV size and function EF 60-65% observe   6. Wound:  F/U Dr Dellia Nims traumatic RLE improved    Current medicines are reviewed at length with the patient today.  The patient does not have concerns regarding medicines.  The following changes have been made:  None   Labs/ tests ordered today include:    No orders of the defined types were placed in this encounter.    Disposition:   FU with me in a year     Signed, Jenkins Rouge, MD  12/29/2021 9:41 AM    [facility] [place], [place], [place]  [address] Phone: [phone]; Fax: [phone]

## 2021-12-29 ENCOUNTER — Ambulatory Visit (INDEPENDENT_AMBULATORY_CARE_PROVIDER_SITE_OTHER): Payer: Medicare Other | Admitting: Cardiovascular Disease

## 2021-12-29 ENCOUNTER — Other Ambulatory Visit: Payer: Self-pay

## 2021-12-29 ENCOUNTER — Encounter: Payer: Self-pay | Admitting: Cardiovascular Disease

## 2021-12-29 VITALS — BP 120/70 | HR 54 | Ht 66.0 in | Wt 173.0 lb

## 2021-12-29 DIAGNOSIS — I10 Essential (primary) hypertension: Secondary | ICD-10-CM

## 2021-12-29 DIAGNOSIS — I351 Nonrheumatic aortic (valve) insufficiency: Secondary | ICD-10-CM | POA: Diagnosis not present

## 2021-12-29 DIAGNOSIS — R001 Bradycardia, unspecified: Secondary | ICD-10-CM

## 2021-12-29 DIAGNOSIS — E782 Mixed hyperlipidemia: Secondary | ICD-10-CM

## 2021-12-29 NOTE — Patient Instructions (Signed)
Medication Instructions:  Your physician recommends that you continue on your current medications as directed. Please refer to the Current Medication list given to you today.  *If you need a refill on your cardiac medications before your next appointment, please call your pharmacy*  Lab Work: If you have labs (blood work) drawn today and your tests are completely normal, you will receive your results only by: [place] (if you have MyChart) OR A paper copy in the mail If you have any lab test that is abnormal or we need to change your treatment, we will call you to review the results.  Follow-Up: At [facility], you and your health needs are our priority.  As part of our continuing mission to provide you with exceptional heart care, we have created designated Provider Care Teams.  These Care Teams include your primary Cardiologist (physician) and Advanced Practice Providers (APPs -  Physician Assistants and Nurse Practitioners) who all work together to provide you with the care you need, when you need it.  We recommend signing up for the patient portal called "MyChart".  Sign up information is provided on this After Visit Summary.  MyChart is used to connect with patients for Virtual Visits (Telemedicine).  Patients are able to view lab/test results, encounter notes, upcoming appointments, etc.  Non-urgent messages can be sent to your provider as well.   To learn more about what you can do with MyChart, go to [email or web address].    Your next appointment:   1 year(s)  The format for your next appointment:   In Person  Provider:   Jenkins Rouge, MD {

## 2022-01-01 DIAGNOSIS — M461 Sacroiliitis, not elsewhere classified: Secondary | ICD-10-CM | POA: Diagnosis not present

## 2022-01-28 DIAGNOSIS — Z20822 Contact with and (suspected) exposure to covid-19: Secondary | ICD-10-CM | POA: Diagnosis not present

## 2022-02-04 DIAGNOSIS — M7072 Other bursitis of hip, left hip: Secondary | ICD-10-CM | POA: Diagnosis not present

## 2022-02-04 DIAGNOSIS — M5416 Radiculopathy, lumbar region: Secondary | ICD-10-CM | POA: Diagnosis not present

## 2022-02-04 DIAGNOSIS — M461 Sacroiliitis, not elsewhere classified: Secondary | ICD-10-CM | POA: Diagnosis not present

## 2022-02-11 ENCOUNTER — Other Ambulatory Visit: Payer: Self-pay | Admitting: Cardiovascular Disease

## 2022-02-16 DIAGNOSIS — L84 Corns and callosities: Secondary | ICD-10-CM | POA: Diagnosis not present

## 2022-02-16 DIAGNOSIS — M2042 Other hammer toe(s) (acquired), left foot: Secondary | ICD-10-CM | POA: Diagnosis not present

## 2022-02-16 DIAGNOSIS — M2041 Other hammer toe(s) (acquired), right foot: Secondary | ICD-10-CM | POA: Diagnosis not present

## 2022-02-16 DIAGNOSIS — M21621 Bunionette of right foot: Secondary | ICD-10-CM | POA: Diagnosis not present

## 2022-02-16 DIAGNOSIS — M21622 Bunionette of left foot: Secondary | ICD-10-CM | POA: Diagnosis not present

## 2022-02-17 DIAGNOSIS — Z1231 Encounter for screening mammogram for malignant neoplasm of breast: Secondary | ICD-10-CM | POA: Diagnosis not present

## 2022-02-28 DIAGNOSIS — Z20822 Contact with and (suspected) exposure to covid-19: Secondary | ICD-10-CM | POA: Diagnosis not present

## 2022-03-02 DIAGNOSIS — M7072 Other bursitis of hip, left hip: Secondary | ICD-10-CM | POA: Diagnosis not present

## 2022-03-16 DIAGNOSIS — M21621 Bunionette of right foot: Secondary | ICD-10-CM | POA: Diagnosis not present

## 2022-03-16 DIAGNOSIS — M21622 Bunionette of left foot: Secondary | ICD-10-CM | POA: Diagnosis not present

## 2022-03-16 DIAGNOSIS — L84 Corns and callosities: Secondary | ICD-10-CM | POA: Diagnosis not present

## 2022-03-16 DIAGNOSIS — M2042 Other hammer toe(s) (acquired), left foot: Secondary | ICD-10-CM | POA: Diagnosis not present

## 2022-03-16 DIAGNOSIS — M2041 Other hammer toe(s) (acquired), right foot: Secondary | ICD-10-CM | POA: Diagnosis not present

## 2022-03-30 DIAGNOSIS — L84 Corns and callosities: Secondary | ICD-10-CM | POA: Diagnosis not present

## 2022-03-30 DIAGNOSIS — M21622 Bunionette of left foot: Secondary | ICD-10-CM | POA: Diagnosis not present

## 2022-03-30 DIAGNOSIS — I70203 Unspecified atherosclerosis of native arteries of extremities, bilateral legs: Secondary | ICD-10-CM | POA: Diagnosis not present

## 2022-03-30 DIAGNOSIS — M2042 Other hammer toe(s) (acquired), left foot: Secondary | ICD-10-CM | POA: Diagnosis not present

## 2022-03-30 DIAGNOSIS — M21621 Bunionette of right foot: Secondary | ICD-10-CM | POA: Diagnosis not present

## 2022-03-30 DIAGNOSIS — M2041 Other hammer toe(s) (acquired), right foot: Secondary | ICD-10-CM | POA: Diagnosis not present

## 2022-04-06 DIAGNOSIS — Z6832 Body mass index (BMI) 32.0-32.9, adult: Secondary | ICD-10-CM | POA: Diagnosis not present

## 2022-04-06 DIAGNOSIS — M47816 Spondylosis without myelopathy or radiculopathy, lumbar region: Secondary | ICD-10-CM | POA: Diagnosis not present

## 2022-04-07 DIAGNOSIS — Z20822 Contact with and (suspected) exposure to covid-19: Secondary | ICD-10-CM | POA: Diagnosis not present

## 2022-04-15 DIAGNOSIS — Z20822 Contact with and (suspected) exposure to covid-19: Secondary | ICD-10-CM | POA: Diagnosis not present

## 2022-04-16 DIAGNOSIS — Z20822 Contact with and (suspected) exposure to covid-19: Secondary | ICD-10-CM | POA: Diagnosis not present

## 2022-04-20 DIAGNOSIS — M21621 Bunionette of right foot: Secondary | ICD-10-CM | POA: Diagnosis not present

## 2022-04-20 DIAGNOSIS — L84 Corns and callosities: Secondary | ICD-10-CM | POA: Diagnosis not present

## 2022-04-20 DIAGNOSIS — M205X1 Other deformities of toe(s) (acquired), right foot: Secondary | ICD-10-CM | POA: Diagnosis not present

## 2022-04-20 DIAGNOSIS — Z23 Encounter for immunization: Secondary | ICD-10-CM | POA: Diagnosis not present

## 2022-04-20 DIAGNOSIS — Z20822 Contact with and (suspected) exposure to covid-19: Secondary | ICD-10-CM | POA: Diagnosis not present

## 2022-04-20 DIAGNOSIS — M21622 Bunionette of left foot: Secondary | ICD-10-CM | POA: Diagnosis not present

## 2022-04-20 DIAGNOSIS — M2041 Other hammer toe(s) (acquired), right foot: Secondary | ICD-10-CM | POA: Diagnosis not present

## 2022-04-24 DIAGNOSIS — M2041 Other hammer toe(s) (acquired), right foot: Secondary | ICD-10-CM | POA: Diagnosis not present

## 2022-04-24 DIAGNOSIS — M205X1 Other deformities of toe(s) (acquired), right foot: Secondary | ICD-10-CM | POA: Diagnosis not present

## 2022-04-24 DIAGNOSIS — G8918 Other acute postprocedural pain: Secondary | ICD-10-CM | POA: Diagnosis not present

## 2022-04-29 DIAGNOSIS — L84 Corns and callosities: Secondary | ICD-10-CM | POA: Diagnosis not present

## 2022-04-29 DIAGNOSIS — M205X1 Other deformities of toe(s) (acquired), right foot: Secondary | ICD-10-CM | POA: Diagnosis not present

## 2022-04-29 DIAGNOSIS — M21622 Bunionette of left foot: Secondary | ICD-10-CM | POA: Diagnosis not present

## 2022-04-29 DIAGNOSIS — M21621 Bunionette of right foot: Secondary | ICD-10-CM | POA: Diagnosis not present

## 2022-04-29 DIAGNOSIS — M2041 Other hammer toe(s) (acquired), right foot: Secondary | ICD-10-CM | POA: Diagnosis not present

## 2022-05-19 DIAGNOSIS — H40013 Open angle with borderline findings, low risk, bilateral: Secondary | ICD-10-CM | POA: Diagnosis not present

## 2022-05-19 DIAGNOSIS — M47816 Spondylosis without myelopathy or radiculopathy, lumbar region: Secondary | ICD-10-CM | POA: Diagnosis not present

## 2022-05-20 DIAGNOSIS — R7303 Prediabetes: Secondary | ICD-10-CM | POA: Diagnosis not present

## 2022-05-20 DIAGNOSIS — E7800 Pure hypercholesterolemia, unspecified: Secondary | ICD-10-CM | POA: Diagnosis not present

## 2022-05-20 DIAGNOSIS — I129 Hypertensive chronic kidney disease with stage 1 through stage 4 chronic kidney disease, or unspecified chronic kidney disease: Secondary | ICD-10-CM | POA: Diagnosis not present

## 2022-05-20 DIAGNOSIS — N1831 Chronic kidney disease, stage 3a: Secondary | ICD-10-CM | POA: Diagnosis not present

## 2022-06-17 DIAGNOSIS — M47816 Spondylosis without myelopathy or radiculopathy, lumbar region: Secondary | ICD-10-CM | POA: Diagnosis not present

## 2022-06-18 DIAGNOSIS — H02413 Mechanical ptosis of bilateral eyelids: Secondary | ICD-10-CM | POA: Diagnosis not present

## 2022-06-18 DIAGNOSIS — H02423 Myogenic ptosis of bilateral eyelids: Secondary | ICD-10-CM | POA: Diagnosis not present

## 2022-06-18 DIAGNOSIS — H53483 Generalized contraction of visual field, bilateral: Secondary | ICD-10-CM | POA: Diagnosis not present

## 2022-06-18 DIAGNOSIS — H57813 Brow ptosis, bilateral: Secondary | ICD-10-CM | POA: Diagnosis not present

## 2022-06-18 DIAGNOSIS — H0279 Other degenerative disorders of eyelid and periocular area: Secondary | ICD-10-CM | POA: Diagnosis not present

## 2022-06-18 DIAGNOSIS — H02834 Dermatochalasis of left upper eyelid: Secondary | ICD-10-CM | POA: Diagnosis not present

## 2022-06-18 DIAGNOSIS — H02831 Dermatochalasis of right upper eyelid: Secondary | ICD-10-CM | POA: Diagnosis not present

## 2022-06-23 DIAGNOSIS — H53483 Generalized contraction of visual field, bilateral: Secondary | ICD-10-CM | POA: Diagnosis not present

## 2022-06-29 DIAGNOSIS — S90121A Contusion of right lesser toe(s) without damage to nail, initial encounter: Secondary | ICD-10-CM | POA: Diagnosis not present

## 2022-07-14 DIAGNOSIS — Z6826 Body mass index (BMI) 26.0-26.9, adult: Secondary | ICD-10-CM | POA: Diagnosis not present

## 2022-07-14 DIAGNOSIS — M47816 Spondylosis without myelopathy or radiculopathy, lumbar region: Secondary | ICD-10-CM | POA: Diagnosis not present

## 2022-08-11 DIAGNOSIS — M47816 Spondylosis without myelopathy or radiculopathy, lumbar region: Secondary | ICD-10-CM | POA: Diagnosis not present

## 2022-08-24 DIAGNOSIS — H53453 Other localized visual field defect, bilateral: Secondary | ICD-10-CM | POA: Diagnosis not present

## 2022-08-24 DIAGNOSIS — H02423 Myogenic ptosis of bilateral eyelids: Secondary | ICD-10-CM | POA: Diagnosis not present

## 2022-08-24 DIAGNOSIS — H57813 Brow ptosis, bilateral: Secondary | ICD-10-CM | POA: Diagnosis not present

## 2022-08-24 DIAGNOSIS — H53483 Generalized contraction of visual field, bilateral: Secondary | ICD-10-CM | POA: Diagnosis not present

## 2022-08-24 DIAGNOSIS — H02831 Dermatochalasis of right upper eyelid: Secondary | ICD-10-CM | POA: Diagnosis not present

## 2022-08-24 DIAGNOSIS — H02834 Dermatochalasis of left upper eyelid: Secondary | ICD-10-CM | POA: Diagnosis not present

## 2022-08-24 DIAGNOSIS — H02413 Mechanical ptosis of bilateral eyelids: Secondary | ICD-10-CM | POA: Diagnosis not present

## 2022-09-04 DIAGNOSIS — Z23 Encounter for immunization: Secondary | ICD-10-CM | POA: Diagnosis not present

## 2022-09-16 DIAGNOSIS — Z23 Encounter for immunization: Secondary | ICD-10-CM | POA: Diagnosis not present

## 2022-09-22 DIAGNOSIS — Z01419 Encounter for gynecological examination (general) (routine) without abnormal findings: Secondary | ICD-10-CM | POA: Diagnosis not present

## 2022-09-22 DIAGNOSIS — Z6826 Body mass index (BMI) 26.0-26.9, adult: Secondary | ICD-10-CM | POA: Diagnosis not present

## 2022-10-12 DIAGNOSIS — K580 Irritable bowel syndrome with diarrhea: Secondary | ICD-10-CM | POA: Diagnosis not present

## 2022-10-12 DIAGNOSIS — K219 Gastro-esophageal reflux disease without esophagitis: Secondary | ICD-10-CM | POA: Diagnosis not present

## 2022-11-04 DIAGNOSIS — L570 Actinic keratosis: Secondary | ICD-10-CM | POA: Diagnosis not present

## 2022-11-04 DIAGNOSIS — D2271 Melanocytic nevi of right lower limb, including hip: Secondary | ICD-10-CM | POA: Diagnosis not present

## 2022-11-04 DIAGNOSIS — L718 Other rosacea: Secondary | ICD-10-CM | POA: Diagnosis not present

## 2022-11-04 DIAGNOSIS — D692 Other nonthrombocytopenic purpura: Secondary | ICD-10-CM | POA: Diagnosis not present

## 2022-11-04 DIAGNOSIS — D225 Melanocytic nevi of trunk: Secondary | ICD-10-CM | POA: Diagnosis not present

## 2022-11-04 DIAGNOSIS — D2361 Other benign neoplasm of skin of right upper limb, including shoulder: Secondary | ICD-10-CM | POA: Diagnosis not present

## 2022-11-04 DIAGNOSIS — L821 Other seborrheic keratosis: Secondary | ICD-10-CM | POA: Diagnosis not present

## 2022-11-04 DIAGNOSIS — D2272 Melanocytic nevi of left lower limb, including hip: Secondary | ICD-10-CM | POA: Diagnosis not present

## 2023-01-19 DIAGNOSIS — G8929 Other chronic pain: Secondary | ICD-10-CM | POA: Diagnosis not present

## 2023-01-19 DIAGNOSIS — K589 Irritable bowel syndrome without diarrhea: Secondary | ICD-10-CM | POA: Diagnosis not present

## 2023-01-19 DIAGNOSIS — N1831 Chronic kidney disease, stage 3a: Secondary | ICD-10-CM | POA: Diagnosis not present

## 2023-01-19 DIAGNOSIS — N951 Menopausal and female climacteric states: Secondary | ICD-10-CM | POA: Diagnosis not present

## 2023-01-19 DIAGNOSIS — K219 Gastro-esophageal reflux disease without esophagitis: Secondary | ICD-10-CM | POA: Diagnosis not present

## 2023-01-19 DIAGNOSIS — I4510 Unspecified right bundle-branch block: Secondary | ICD-10-CM | POA: Diagnosis not present

## 2023-01-19 DIAGNOSIS — M545 Low back pain, unspecified: Secondary | ICD-10-CM | POA: Diagnosis not present

## 2023-01-19 DIAGNOSIS — Z0000 Encounter for general adult medical examination without abnormal findings: Secondary | ICD-10-CM | POA: Diagnosis not present

## 2023-01-19 DIAGNOSIS — R7303 Prediabetes: Secondary | ICD-10-CM | POA: Diagnosis not present

## 2023-01-19 DIAGNOSIS — I129 Hypertensive chronic kidney disease with stage 1 through stage 4 chronic kidney disease, or unspecified chronic kidney disease: Secondary | ICD-10-CM | POA: Diagnosis not present

## 2023-01-19 DIAGNOSIS — E669 Obesity, unspecified: Secondary | ICD-10-CM | POA: Diagnosis not present

## 2023-01-19 DIAGNOSIS — E7800 Pure hypercholesterolemia, unspecified: Secondary | ICD-10-CM | POA: Diagnosis not present

## 2023-02-08 ENCOUNTER — Other Ambulatory Visit: Payer: Self-pay | Admitting: *Deleted

## 2023-02-08 MED ORDER — TRIAMTERENE-HCTZ 37.5-25 MG PO CAPS: 1.0000 | ORAL_CAPSULE | Freq: Every day | ORAL | 0 refills | Status: DC

## 2023-02-09 ENCOUNTER — Other Ambulatory Visit: Payer: Self-pay | Admitting: *Deleted

## 2023-02-24 DIAGNOSIS — Z1231 Encounter for screening mammogram for malignant neoplasm of breast: Secondary | ICD-10-CM | POA: Diagnosis not present

## 2023-03-02 ENCOUNTER — Other Ambulatory Visit: Payer: Self-pay | Admitting: Cardiovascular Disease

## 2023-03-22 DIAGNOSIS — Z780 Asymptomatic menopausal state: Secondary | ICD-10-CM | POA: Diagnosis not present

## 2023-03-24 DIAGNOSIS — M47816 Spondylosis without myelopathy or radiculopathy, lumbar region: Secondary | ICD-10-CM | POA: Diagnosis not present

## 2023-06-14 DIAGNOSIS — R350 Frequency of micturition: Secondary | ICD-10-CM | POA: Diagnosis not present

## 2023-07-19 DIAGNOSIS — R7303 Prediabetes: Secondary | ICD-10-CM | POA: Diagnosis not present

## 2023-07-19 DIAGNOSIS — N1831 Chronic kidney disease, stage 3a: Secondary | ICD-10-CM | POA: Diagnosis not present

## 2023-07-19 DIAGNOSIS — M6740 Ganglion, unspecified site: Secondary | ICD-10-CM | POA: Diagnosis not present

## 2023-07-19 DIAGNOSIS — D692 Other nonthrombocytopenic purpura: Secondary | ICD-10-CM | POA: Diagnosis not present

## 2023-07-19 DIAGNOSIS — M545 Low back pain, unspecified: Secondary | ICD-10-CM | POA: Diagnosis not present

## 2023-07-19 DIAGNOSIS — I129 Hypertensive chronic kidney disease with stage 1 through stage 4 chronic kidney disease, or unspecified chronic kidney disease: Secondary | ICD-10-CM | POA: Diagnosis not present

## 2023-07-19 DIAGNOSIS — K589 Irritable bowel syndrome without diarrhea: Secondary | ICD-10-CM | POA: Diagnosis not present

## 2023-07-19 DIAGNOSIS — K219 Gastro-esophageal reflux disease without esophagitis: Secondary | ICD-10-CM | POA: Diagnosis not present

## 2023-07-19 DIAGNOSIS — Z6827 Body mass index (BMI) 27.0-27.9, adult: Secondary | ICD-10-CM | POA: Diagnosis not present

## 2023-07-19 DIAGNOSIS — E7800 Pure hypercholesterolemia, unspecified: Secondary | ICD-10-CM | POA: Diagnosis not present

## 2023-07-19 DIAGNOSIS — E669 Obesity, unspecified: Secondary | ICD-10-CM | POA: Diagnosis not present

## 2023-08-03 DIAGNOSIS — M67441 Ganglion, right hand: Secondary | ICD-10-CM | POA: Diagnosis not present

## 2023-08-09 DIAGNOSIS — H40013 Open angle with borderline findings, low risk, bilateral: Secondary | ICD-10-CM | POA: Diagnosis not present

## 2023-08-09 DIAGNOSIS — H524 Presbyopia: Secondary | ICD-10-CM | POA: Diagnosis not present

## 2023-08-18 DIAGNOSIS — Z23 Encounter for immunization: Secondary | ICD-10-CM | POA: Diagnosis not present

## 2023-09-02 DIAGNOSIS — M19072 Primary osteoarthritis, left ankle and foot: Secondary | ICD-10-CM | POA: Diagnosis not present

## 2023-09-02 DIAGNOSIS — M792 Neuralgia and neuritis, unspecified: Secondary | ICD-10-CM | POA: Diagnosis not present

## 2023-09-02 DIAGNOSIS — M65872 Other synovitis and tenosynovitis, left ankle and foot: Secondary | ICD-10-CM | POA: Diagnosis not present

## 2023-09-02 DIAGNOSIS — M898X9 Other specified disorders of bone, unspecified site: Secondary | ICD-10-CM | POA: Diagnosis not present

## 2023-09-02 DIAGNOSIS — M19071 Primary osteoarthritis, right ankle and foot: Secondary | ICD-10-CM | POA: Diagnosis not present

## 2023-09-23 DIAGNOSIS — M792 Neuralgia and neuritis, unspecified: Secondary | ICD-10-CM | POA: Diagnosis not present

## 2023-09-23 DIAGNOSIS — M898X9 Other specified disorders of bone, unspecified site: Secondary | ICD-10-CM | POA: Diagnosis not present

## 2023-09-23 DIAGNOSIS — M19072 Primary osteoarthritis, left ankle and foot: Secondary | ICD-10-CM | POA: Diagnosis not present

## 2023-09-23 DIAGNOSIS — M19071 Primary osteoarthritis, right ankle and foot: Secondary | ICD-10-CM | POA: Diagnosis not present

## 2023-09-23 DIAGNOSIS — M65872 Other synovitis and tenosynovitis, left ankle and foot: Secondary | ICD-10-CM | POA: Diagnosis not present

## 2023-09-27 DIAGNOSIS — Z6828 Body mass index (BMI) 28.0-28.9, adult: Secondary | ICD-10-CM | POA: Diagnosis not present

## 2023-09-27 DIAGNOSIS — Z01419 Encounter for gynecological examination (general) (routine) without abnormal findings: Secondary | ICD-10-CM | POA: Diagnosis not present

## 2023-09-28 ENCOUNTER — Other Ambulatory Visit: Payer: Self-pay | Admitting: Obstetrics and Gynecology

## 2023-09-28 DIAGNOSIS — Z87891 Personal history of nicotine dependence: Secondary | ICD-10-CM

## 2023-10-04 ENCOUNTER — Other Ambulatory Visit: Payer: Self-pay | Admitting: Obstetrics and Gynecology

## 2023-10-04 DIAGNOSIS — Z87891 Personal history of nicotine dependence: Secondary | ICD-10-CM

## 2023-10-14 ENCOUNTER — Ambulatory Visit
Admission: RE | Admit: 2023-10-14 | Discharge: 2023-10-14 | Disposition: A | Discharge status: Home / Self Care | Payer: Medicare Other | Source: Ambulatory Visit | Attending: Obstetrics and Gynecology | Admitting: Obstetrics and Gynecology

## 2023-10-14 DIAGNOSIS — Z87891 Personal history of nicotine dependence: Secondary | ICD-10-CM | POA: Diagnosis not present

## 2023-10-14 DIAGNOSIS — I700 Atherosclerosis of aorta: Secondary | ICD-10-CM | POA: Diagnosis not present

## 2023-11-08 DIAGNOSIS — L821 Other seborrheic keratosis: Secondary | ICD-10-CM | POA: Diagnosis not present

## 2023-11-08 DIAGNOSIS — L814 Other melanin hyperpigmentation: Secondary | ICD-10-CM | POA: Diagnosis not present

## 2023-11-08 DIAGNOSIS — D225 Melanocytic nevi of trunk: Secondary | ICD-10-CM | POA: Diagnosis not present

## 2023-11-08 DIAGNOSIS — D692 Other nonthrombocytopenic purpura: Secondary | ICD-10-CM | POA: Diagnosis not present

## 2023-11-08 DIAGNOSIS — Z85828 Personal history of other malignant neoplasm of skin: Secondary | ICD-10-CM | POA: Diagnosis not present

## 2023-11-08 DIAGNOSIS — D2272 Melanocytic nevi of left lower limb, including hip: Secondary | ICD-10-CM | POA: Diagnosis not present

## 2023-11-08 DIAGNOSIS — L905 Scar conditions and fibrosis of skin: Secondary | ICD-10-CM | POA: Diagnosis not present

## 2023-11-08 DIAGNOSIS — D2271 Melanocytic nevi of right lower limb, including hip: Secondary | ICD-10-CM | POA: Diagnosis not present

## 2023-11-08 DIAGNOSIS — L570 Actinic keratosis: Secondary | ICD-10-CM | POA: Diagnosis not present

## 2024-01-04 ENCOUNTER — Other Ambulatory Visit: Payer: Self-pay | Admitting: Cardiovascular Disease

## 2024-01-12 ENCOUNTER — Telehealth: Payer: Self-pay | Admitting: Cardiovascular Disease

## 2024-01-12 NOTE — Telephone Encounter (Signed)
*  STAT* If patient is at the pharmacy, call can be transferred to refill team.   1. Which medications need to be refilled? (please list name of each medication and dose if known)   triamterene-hydrochlorothiazide (DYAZIDE) 37.5-25 MG capsule   2. Would you like to learn more about the convenience, safety, & potential cost savings by using the [facility]?   3. Are you open to using the [facility] (Type [facility]. ).  4. Which pharmacy/location (including street and city if local pharmacy) is medication to be sent to?  [facility] - [place], [place] - [address]   5. Do they need a 30 day or 90 day supply?   90 day  Patient stated she still has some medication.  Patient has appointment scheduled on 4/7.

## 2024-02-01 ENCOUNTER — Ambulatory Visit: Payer: Medicare Other | Attending: Cardiovascular Disease | Admitting: Cardiovascular Disease

## 2024-02-01 ENCOUNTER — Encounter: Payer: Self-pay | Admitting: Cardiovascular Disease

## 2024-02-01 VITALS — BP 120/76 | HR 72 | Ht 65.0 in | Wt 170.2 lb

## 2024-02-01 DIAGNOSIS — E782 Mixed hyperlipidemia: Secondary | ICD-10-CM | POA: Diagnosis present

## 2024-02-01 DIAGNOSIS — I351 Nonrheumatic aortic (valve) insufficiency: Secondary | ICD-10-CM | POA: Insufficient documentation

## 2024-02-01 NOTE — Patient Instructions (Signed)
Medication Instructions:  Your physician recommends that you continue on your current medications as directed. Please refer to the Current Medication list given to you today.  *If you need a refill on your cardiac medications before your next appointment, please call your pharmacy*  Lab Work: If you have labs (blood work) drawn today and your tests are completely normal, you will receive your results only by: MyChart Message (if you have MyChart) OR A paper copy in the mail If you have any lab test that is abnormal or we need to change your treatment, we will call you to review the results.  Testing/Procedures: Your physician has requested that you have an echocardiogram in one year. Echocardiography is a painless test that uses sound waves to create images of your heart. It provides your doctor with information about the size and shape of your heart and how well your heart's chambers and valves are working. This procedure takes approximately one hour. There are no restrictions for this procedure. Please do NOT wear cologne, perfume, aftershave, or lotions (deodorant is allowed). Please arrive 15 minutes prior to your appointment time.  Please note: We ask at that you not bring children with you during ultrasound (echo/ vascular) testing. Due to room size and safety concerns, children are not allowed in the ultrasound rooms during exams. Our front office staff cannot provide observation of children in our lobby area while testing is being conducted. An adult accompanying a patient to their appointment will only be allowed in the ultrasound room at the discretion of the ultrasound technician under special circumstances. We apologize for any inconvenience. Follow-Up: At [facility], you and your health needs are our priority.  As part of our continuing mission to provide you with exceptional heart care, we have created designated Provider Care Teams.  These Care Teams include your primary  Cardiologist (physician) and Advanced Practice Providers (APPs -  Physician Assistants and Nurse Practitioners) who all work together to provide you with the care you need, when you need it.  We recommend signing up for the patient portal called "MyChart".  Sign up information is provided on this After Visit Summary.  MyChart is used to connect with patients for Virtual Visits (Telemedicine).  Patients are able to view lab/test results, encounter notes, upcoming appointments, etc.  Non-urgent messages can be sent to your provider as well.   To learn more about what you can do with MyChart, go to [email or web address].    Your next appointment:   1 year(s)  Provider:   Charlton Haws, MD     Other Instructions    1st Floor: - Lobby - Registration  - Pharmacy  - Lab - Cafe  2nd Floor: - PV Lab - Diagnostic Testing (echo, CT, nuclear med)  3rd Floor: - Vacant  4th Floor: - TCTS (cardiothoracic surgery) - AFib Clinic - Structural Heart Clinic - Vascular Surgery  - Vascular Ultrasound  5th Floor: - [facility]Care Cardiology (general and EP) - Clinical Pharmacy for coumadin, hypertension, lipid, weight-loss medications, and med management appointments    Valet parking services will be available as well.

## 2024-02-01 NOTE — Progress Notes (Signed)
Cardiology Office Note   Date:  02/01/2024   ID:  Abigail, Guzman 1946-10-25, MRN [id number]  PCP:  Maurice Small, MD (Inactive)  Cardiologist:   Charlton Haws, MD   No chief complaint on file.     History of Present Illness: Abigail Guzman is a 78 y.o. female seen in f/u for bradycardia. First seen April 2019  Referred by Dr Valentina Lucks Reviewed her office note indicating some symptomatic dizziness. Patient has been on a diet and not clear if symptoms related to this or low HR. No chest pain or dyspnea Occasional palpitations   She has chronic RBBB F/U ETT 03/29/18 non ischemic and she reached peak HR 128 bpm  TTE 03/29/18 with normal EF 60-65% and only mild AR  Activity limited by back pain. Has seen ortho, chiropractor and PT with injections Now on Tylenol 3 getting "ablation" with neurosurgery tomorrow  Sister diagnosed with CML.   October 2021 had a vagal episode Had been gardening for 4 days and felt dizzy jumping up to answer Door bell no palpitations   Seeing wound center for traumatic RLE contusion June 2022 from car door   No cardiac complaints Discussed updating echo for AVD next year     Past Medical History:  Diagnosis Date   Arthritis    Bradycardia    GERD (gastroesophageal reflux disease)    Hiatal hernia    Hyperlipidemia    Joint pain    Left ovarian cyst 04/12/2017   Night sweats    Palpitation    Prediabetes     Past Surgical History:  Procedure Laterality Date   ABDOMINAL HYSTERECTOMY     COSMETIC SURGERY       Current Outpatient Medications  Medication Sig Dispense Refill   DICYCLOMINE HCL PO Take by mouth.     meloxicam (MOBIC) 15 MG tablet meloxicam 15 mg tablet   1 tablet as needed by oral route.     omeprazole (PRILOSEC) 40 MG capsule Take 40 mg by mouth every other day.     Probiotic Product (PROBIOTIC PO) Take by mouth as directed.     rosuvastatin (CRESTOR) 10 MG tablet Take 10 mg by mouth every other day.      Specialty Vitamins Products (COLLAGEN ULTRA PO) Take by mouth. TAKE AS DIRECTED     triamterene-hydrochlorothiazide (DYAZIDE) 37.5-25 MG capsule TAKE 1 CAPSULE EVERY DAY (PLEASE CALL OFFICE AT [phone] TO SCHEDULE AN APPT WITH DR. Eden Emms FOR FUTURE REFILLS) 15 capsule 0   TURMERIC PO Take by mouth as directed.     VITAMIN D, CHOLECALCIFEROL, PO Take 1 tablet by mouth daily.     hydrocortisone (ANUSOL-HC) 2.5 % rectal cream Place 1 application rectally 2 (two) times daily. (Patient not taking: Reported on 02/01/2024)     hyoscyamine (LEVSIN) 0.125 MG/5ML ELIX Take 0.125 mg by mouth as directed. (Patient not taking: Reported on 02/01/2024)     Current Facility-Administered Medications  Medication Dose Route Frequency Provider Last Rate Last Admin   cetirizine (ZYRTEC) tablet 10 mg  10 mg Oral Once Ofilia Neas, PA-C        Allergies:   Sulfa antibiotics    Social History:  The patient  reports that she quit smoking about 44 years ago. Her smoking use included cigarettes. She started smoking about 54 years ago. She has a 10 pack-year smoking history. She has never used smokeless tobacco. She reports current alcohol use of about 2.0 standard drinks of alcohol per  week. She reports current drug use. Drug: Marijuana.   Family History:  The patient's family history includes Diabetes in her father; Heart disease in her father; Stroke in her mother.    ROS:  Please see the history of present illness.   Otherwise, review of systems are positive for none.   All other systems are reviewed and negative.    PHYSICAL EXAM: VS:  BP 120/76   Pulse 72   Ht 5\' 5"  (1.651 m)   Wt 170 lb 3.2 oz (77.2 kg)   SpO2 98%   BMI 28.32 kg/m  , BMI Body mass index is 28.32 kg/m. Affect appropriate Healthy:  appears stated age HEENT: normal Neck supple with no adenopathy JVP normal no bruits no thyromegaly Lungs clear with no wheezing and good diaphragmatic motion Heart:  S1/S2 mild AR  murmur, no rub,  gallop or click PMI normal Abdomen: benighn, BS positve, no tenderness, no AAA no bruit.  No HSM or HJR Distal pulses intact with no bruits No edema Neuro non-focal Mild bruising RLE improved  No muscular weakness     EKG:  02/01/2024 SR RBBB/LAFB no acute changes    Recent Labs: No results found for requested labs within last 365 days.    Lipid Panel No results found for: "CHOL", "TRIG", "HDL", "CHOLHDL", "VLDL", "LDLCALC", "LDLDIRECT"    Wt Readings from Last 3 Encounters:  02/01/24 170 lb 3.2 oz (77.2 kg)  12/29/21 173 lb (78.5 kg)  10/03/20 210 lb 12.8 oz (95.6 kg)      Other studies Reviewed: Additional studies/ records that were reviewed today include: Notes Dr Valentina Lucks ECG and labs .ETT 03/29/18 And TTE 03/29/18    ASSESSMENT AND PLAN:  1. Bradycardia seems benign and not related to any symptoms RBBB also benign  Peak HR during ETT 03/29/18 acceptable at 125 bpm and HR today in 80's  stable  2. HTN:  Well controlled.  Continue current medications and low sodium Dash type diet.    3. HLD on statin labs with primary   4. Pre DM:  Discussed low carb diet   5. AR:  Mild by echo 03/29/18 with normal LV size and function EF 60-65% no murmur on exam can consider update TTE in a  year     Current medicines are reviewed at length with the patient today.  The patient does not have concerns regarding medicines.  The following changes have been made:  None   Labs/ tests ordered today TTE for AVD in a year    Orders Placed This Encounter  Procedures   EKG 12-Lead     Disposition:   FU with me in a year     Signed, Charlton Haws, MD  02/01/2024 4:38 PM    [facility] [address] [place], [place], [place]  [address] Phone: [phone]; Fax: [phone]

## 2024-02-29 ENCOUNTER — Other Ambulatory Visit: Payer: Self-pay

## 2024-02-29 MED ORDER — TRIAMTERENE-HCTZ 37.5-25 MG PO CAPS: 1.0000 | ORAL_CAPSULE | Freq: Every day | ORAL | 3 refills | Status: DC

## 2024-03-20 ENCOUNTER — Ambulatory Visit: Payer: Medicare Other | Admitting: Cardiovascular Disease

## 2024-05-26 ENCOUNTER — Ambulatory Visit (HOSPITAL_COMMUNITY): Payer: Self-pay | Admitting: Physician Assistant

## 2024-05-26 ENCOUNTER — Ambulatory Visit (INDEPENDENT_AMBULATORY_CARE_PROVIDER_SITE_OTHER)

## 2024-05-26 ENCOUNTER — Ambulatory Visit (HOSPITAL_COMMUNITY)
Admission: EM | Admit: 2024-05-26 | Discharge: 2024-05-26 | Disposition: A | Discharge status: Home / Self Care | Attending: Family Medicine | Admitting: Family Medicine

## 2024-05-26 ENCOUNTER — Encounter (HOSPITAL_COMMUNITY): Payer: Self-pay | Admitting: Emergency Medicine

## 2024-05-26 DIAGNOSIS — M25532 Pain in left wrist: Secondary | ICD-10-CM

## 2024-05-26 NOTE — ED Provider Notes (Signed)
 [facility]    CSN: [id number] Arrival date & time: 05/26/24  1727      History   Chief Complaint No chief complaint on file.   HPI Abigail Guzman is a 78 y.o. female.   The history is provided by the patient.  Left hand pain.  Patient states she was assisting a friend today within the [place], afterwards while driving home she developed pain in her left hand near her wrist associated with swelling.  Pain has worsened since then now feels it travels up her arm some.  Pain is constant, worse with certain movements, admits tenderness Denies known injury, fever, chills, sweats, paresthesias, chest pain, palpitations, shortness of breath, sweating, nausea.  Past Medical History:  Diagnosis Date   Arthritis    Bradycardia    GERD (gastroesophageal reflux disease)    Hiatal hernia    Hyperlipidemia    Joint pain    Left ovarian cyst 04/12/2017   Night sweats    Palpitation    Prediabetes     Patient Active Problem List   Diagnosis Date Noted   Left ovarian cyst 04/12/2017    Past Surgical History:  Procedure Laterality Date   ABDOMINAL HYSTERECTOMY     COSMETIC SURGERY      OB History   No obstetric history on file.      Home Medications    Prior to Admission medications   Medication Sig Start Date End Date Taking? Authorizing Provider  DICYCLOMINE HCL PO Take by mouth.    [provider]  hydrocortisone (ANUSOL-HC) 2.5 % rectal cream Place 1 application rectally 2 (two) times daily. Patient not taking: Reported on 02/01/2024    [provider]  hyoscyamine (LEVSIN) 0.125 MG/5ML ELIX Take 0.125 mg by mouth as directed. Patient not taking: Reported on 02/01/2024    [provider]  meloxicam (MOBIC) 15 MG tablet meloxicam 15 mg tablet   1 tablet as needed by oral route. 01/28/19   [provider]  omeprazole (PRILOSEC) 40 MG capsule Take 40 mg by mouth every other day.    [provider]  Probiotic  Product (PROBIOTIC PO) Take by mouth as directed.    [provider]  rosuvastatin (CRESTOR) 10 MG tablet Take 10 mg by mouth every other day.    [provider]  Specialty Vitamins Products (COLLAGEN ULTRA PO) Take by mouth. TAKE AS DIRECTED    [provider]  triamterene -hydrochlorothiazide (DYAZIDE) 37.5-25 MG capsule Take 1 each (1 capsule total) by mouth daily. 02/29/24   Nishan, Peter C, MD  TURMERIC PO Take by mouth as directed.    [provider]  VITAMIN D, CHOLECALCIFEROL, PO Take 1 tablet by mouth daily.    [provider]    Family History Family History  Problem Relation Age of Onset   Stroke Mother    Heart disease Father    Diabetes Father     Social History Social History   Tobacco Use   Smoking status: Former    Current packs/day: 0.00    Average packs/day: 1 pack/day for 10.0 years (10.0 ttl pk-yrs)    Types: Cigarettes    Start date: 04/12/1969    Quit date: 04/13/1979    Years since quitting: 45.1   Smokeless tobacco: Never  Vaping Use   Vaping status: Never Used  Substance Use Topics   Alcohol use: Yes    Alcohol/week: 2.0 standard drinks of alcohol    Types: 2 Glasses of  wine per week   Drug use: Yes    Types: Marijuana     Allergies   Sulfa antibiotics   Review of Systems Review of Systems  Constitutional:  Negative for chills, fatigue and fever.  Respiratory:  Negative for cough and shortness of breath.   Cardiovascular:  Negative for chest pain.  Gastrointestinal:  Negative for abdominal pain, nausea and vomiting.  Musculoskeletal:  Positive for arthralgias and joint swelling.  Skin:  Negative for color change and rash.  Neurological:  Negative for dizziness.     Physical Exam Triage Vital Signs ED Triage Vitals  Encounter Vitals Group     BP 05/26/24 1817 (!) 144/88     Girls Systolic BP Percentile --      Girls Diastolic BP Percentile --      Boys Systolic BP Percentile --      Boys  Diastolic BP Percentile --      Pulse Rate 05/26/24 1817 90     Resp 05/26/24 1817 18     Temp 05/26/24 1817 97.8 F (36.6 C)     Temp Source 05/26/24 1817 Oral     SpO2 05/26/24 1817 98 %     Weight --      Height --      Head Circumference --      Peak Flow --      Pain Score 05/26/24 1815 10     Pain Loc --      Pain Education --      Exclude from Growth Chart --    No data found.  Updated Vital Signs BP (!) 144/88 (BP Location: Right Arm)   Pulse 90   Temp 97.8 F (36.6 C) (Oral)   Resp 18   SpO2 98%   Visual Acuity Right Eye Distance:   Left Eye Distance:   Bilateral Distance:    Right Eye Near:   Left Eye Near:    Bilateral Near:     Physical Exam Vitals and nursing note reviewed.  Constitutional:      Appearance: She is not ill-appearing.  HENT:     Head: Normocephalic and atraumatic.   Cardiovascular:     Heart sounds: Normal heart sounds.  Pulmonary:     Effort: Pulmonary effort is normal. No respiratory distress.   Musculoskeletal:        General: Swelling and tenderness present.     Left shoulder: Normal.     Left elbow: Normal.     Left wrist: Swelling and bony tenderness present. Decreased range of motion. Normal pulse.     Comments: Left wrist swelling dorsal surface over first MCP carpal joint, local warmth no erythema, mild tenderness.  Rest of left hand and wrist exam is normal, good pulse, no deformity, moves all digits, neurovascular intact.   Skin:    General: Skin is warm and dry.     Findings: No erythema.   Neurological:     Mental Status: She is alert and oriented to person, place, and time.      UC Treatments / Results  Labs (all labs ordered are listed, but only abnormal results are displayed) Labs Reviewed - No data to display  EKG   Radiology No results found.  Procedures Procedures (including critical care time)  Medications Ordered in UC Medications - No data to display  Initial Impression / Assessment and  Plan / UC Course  I have reviewed the triage vital signs and the nursing notes.  Pertinent labs &  imaging results that were available during my care of the patient were reviewed by me and considered in my medical decision making (see chart for details).     EKG independently viewed by me sinus rhythm rate of 79 with premature atrial complexes right bundle branch block with fascicular block no acute ischemic changes no significant change from EKG dated February 01, 2024  Exam and x-ray consistent with arthritis, recommend patient continue her over-the-counter topical or oral NSAIDs, warm compresses, follow-up PCP as needed  Final Clinical Impressions(s) / UC Diagnoses   Final diagnoses:  Left wrist pain     Discharge Instructions      The x-ray reading we discussed is preliminary. Your x-ray will be read by a radiologist in next few hours. If there is a discrepancy, you will be contacted, and instructed on a new plan for you care.       ED Prescriptions   None    PDMP not reviewed this encounter.   Aulani Shipton, [place] 05/26/24 1932

## 2024-05-26 NOTE — Discharge Instructions (Signed)
The x-ray reading we discussed is preliminary. Your x-ray will be read by a radiologist in next few hours. If there is a discrepancy, you will be contacted, and instructed on a new plan for you care.

## 2024-05-26 NOTE — ED Triage Notes (Signed)
 Pt repots when driving got horrible stabbing pain in left hand. Pain now radiating up arm. Put Voltaren when got home. Tried heat and ice both didn't help. Reports that can barely make a fist or use left hand due to pain. Even took Meloxicam and hasn't helped with pain.

## 2024-11-04 ENCOUNTER — Emergency Department (HOSPITAL_BASED_OUTPATIENT_CLINIC_OR_DEPARTMENT_OTHER): Admitting: Radiology

## 2024-11-04 ENCOUNTER — Emergency Department (HOSPITAL_BASED_OUTPATIENT_CLINIC_OR_DEPARTMENT_OTHER)
Admission: EM | Admit: 2024-11-04 | Discharge: 2024-11-04 | Disposition: A | Discharge status: Home / Self Care | Attending: Emergency Medicine | Admitting: Emergency Medicine

## 2024-11-04 ENCOUNTER — Encounter (HOSPITAL_BASED_OUTPATIENT_CLINIC_OR_DEPARTMENT_OTHER): Payer: Self-pay

## 2024-11-04 ENCOUNTER — Other Ambulatory Visit: Payer: Self-pay

## 2024-11-04 ENCOUNTER — Other Ambulatory Visit (HOSPITAL_BASED_OUTPATIENT_CLINIC_OR_DEPARTMENT_OTHER): Payer: Self-pay

## 2024-11-04 DIAGNOSIS — Z23 Encounter for immunization: Secondary | ICD-10-CM | POA: Diagnosis not present

## 2024-11-04 DIAGNOSIS — Y9301 Activity, walking, marching and hiking: Secondary | ICD-10-CM | POA: Diagnosis not present

## 2024-11-04 DIAGNOSIS — W268XXA Contact with other sharp object(s), not elsewhere classified, initial encounter: Secondary | ICD-10-CM | POA: Diagnosis not present

## 2024-11-04 DIAGNOSIS — S81812A Laceration without foreign body, left lower leg, initial encounter: Secondary | ICD-10-CM | POA: Insufficient documentation

## 2024-11-04 DIAGNOSIS — S8992XA Unspecified injury of left lower leg, initial encounter: Secondary | ICD-10-CM | POA: Diagnosis present

## 2024-11-04 MED ORDER — CEPHALEXIN 500 MG PO CAPS
500.0000 mg | ORAL_CAPSULE | Freq: Two times a day (BID) | ORAL | 0 refills | Status: AC
Start: 2024-11-04 — End: 2024-11-07
  Filled 2024-11-04: qty 6, 3d supply, fill #0

## 2024-11-04 MED ORDER — TETANUS-DIPHTH-ACELL PERTUSSIS 5-2-15.5 LF-MCG/0.5 IM SUSP
0.5000 mL | Freq: Once | INTRAMUSCULAR | Status: AC
  Administered 2024-11-04: 0.5 mL via INTRAMUSCULAR
  Filled 2024-11-04: qty 0.5

## 2024-11-04 NOTE — ED Provider Notes (Signed)
 [place] EMERGENCY DEPARTMENT AT [facility] Provider Note   CSN: [id number] Arrival date & time: 11/04/24  1023     Patient presents with: Leg Injury   Abigail Guzman is a 78 y.o. female.   This is a 78 year old female who is here today after she cut her left shin when she walked into a leaf blower 2 days ago.  She is unsure of her last tetanus, she is not on blood thinners.        Prior to Admission medications   Medication Sig Start Date End Date Taking? Authorizing Provider  cephALEXin  (KEFLEX ) 500 MG capsule Take 1 capsule (500 mg total) by mouth 2 (two) times daily for 3 days. 11/04/24 11/07/24 Yes Mannie Pac T, DO  DICYCLOMINE HCL PO Take by mouth.    [provider]  hydrocortisone (ANUSOL-HC) 2.5 % rectal cream Place 1 application rectally 2 (two) times daily. Patient not taking: Reported on 02/01/2024    [provider]  hyoscyamine (LEVSIN) 0.125 MG/5ML ELIX Take 0.125 mg by mouth as directed. Patient not taking: Reported on 02/01/2024    [provider]  meloxicam (MOBIC) 15 MG tablet meloxicam 15 mg tablet   1 tablet as needed by oral route. 01/28/19   [provider]  omeprazole (PRILOSEC) 40 MG capsule Take 40 mg by mouth every other day.    [provider]  Probiotic Product (PROBIOTIC PO) Take by mouth as directed.    [provider]  rosuvastatin (CRESTOR) 10 MG tablet Take 10 mg by mouth every other day.    [provider]  Specialty Vitamins Products (COLLAGEN ULTRA PO) Take by mouth. TAKE AS DIRECTED    [provider]  triamterene -hydrochlorothiazide (DYAZIDE) 37.5-25 MG capsule Take 1 each (1 capsule total) by mouth daily. 02/29/24   Nishan, Peter C, MD  TURMERIC PO Take by mouth as directed.    [provider]  VITAMIN D, CHOLECALCIFEROL, PO Take 1 tablet by mouth daily.    [provider]    Allergies: Sulfa antibiotics    Review of  Systems  Updated Vital Signs BP 124/88   Pulse 68   Temp 98.1 F (36.7 C) (Oral)   Resp 16   SpO2 96%   Physical Exam Vitals and nursing note reviewed.  Cardiovascular:     Pulses: Normal pulses.  Pulmonary:     Effort: Pulmonary effort is normal.  Abdominal:     General: Abdomen is flat.  Musculoskeletal:        General: Tenderness present. No deformity.  Skin:    Comments: 6.5 elliptical laceration on the lateral left shin  Neurological:     Mental Status: She is alert.     Gait: Gait normal.     (all labs ordered are listed, but only abnormal results are displayed) Labs Reviewed - No data to display  EKG: None  Radiology: DG Tibia/Fibula Left Result Date: 11/04/2024 CLINICAL DATA:  Trauma. EXAM: LEFT TIBIA AND FIBULA - 2 VIEW COMPARISON:  None Available. FINDINGS: No acute fracture or dislocation. There is moderate arthritic changes of the knee with tricompartmental narrowing and spurring. No joint effusion. The soft tissues are unremarkable. IMPRESSION: 1. No acute fracture or dislocation. 2. Moderate arthritic changes of the knee. Electronically Signed   By: Vanetta Chou M.D.   On: 11/04/2024 11:47     Procedures   Medications Ordered in the ED  Tdap (ADACEL ) injection 0.5 mL (0.5 mLs Intramuscular Given 11/04/24 1117)  Medical Decision Making 78 year old female here today with left shin pain after it was cut 2 days ago.  Differential diagnoses include laceration, consider fracture, less likely retained foreign body.  Plan-Tdap ordered.  Will obtain plain films to rule out occult fracture.  Wound care provided.  Will discharge with prophylactic antibiotics.  Reassessment 11:50 AM-negative plain films.  Wound care supplies provided.  Patient appropriate for discharge.  She will follow-up with her PCP.  Amount and/or Complexity of Data Reviewed Radiology: ordered.  Risk Prescription drug management.         Final diagnoses:  Laceration of left lower extremity, initial encounter    ED Discharge Orders          Ordered    cephALEXin  (KEFLEX ) 500 MG capsule  2 times daily        11/04/24 1055               Mannie Pac T, DO 11/04/24 1153

## 2024-11-04 NOTE — Discharge Instructions (Addendum)
 Your x-ray is negative.  You received a tetanus shot today.  I have sent you prescription for Keflex .  This is an antibiotic.  I would like you to take it once in the morning once in the evening for the next 3 days to help prevent infection.  Apply antibiotic ointment to the area, keep the area wrapped during the day and gently wash it with soap and water in the evening.  Return to the emergency room if you develop redness, pus from the area.

## 2024-11-04 NOTE — ED Notes (Signed)
 ED Provider at bedside.

## 2024-11-04 NOTE — ED Triage Notes (Signed)
 She states she sustained a lac. At left lat. Mid/lower shin area when she bumped into my leaf-blower 2 days ago. She is asking for a provider to examine it and make recommendations.

## 2024-11-04 NOTE — ED Triage Notes (Signed)
 Pt reports run in with leaf blower, c/o LLE injury with skin tear x 3 days pta. Bruising noted Bandage applied on arrival. 6.5cm x 3cm. Last tetanus unknown

## 2024-12-06 ENCOUNTER — Encounter (HOSPITAL_BASED_OUTPATIENT_CLINIC_OR_DEPARTMENT_OTHER): Attending: General Surgery | Admitting: General Surgery

## 2024-12-06 DIAGNOSIS — N1832 Chronic kidney disease, stage 3b: Secondary | ICD-10-CM | POA: Insufficient documentation

## 2024-12-06 DIAGNOSIS — R7303 Prediabetes: Secondary | ICD-10-CM | POA: Diagnosis not present

## 2024-12-06 DIAGNOSIS — L97822 Non-pressure chronic ulcer of other part of left lower leg with fat layer exposed: Secondary | ICD-10-CM | POA: Diagnosis present

## 2024-12-11 ENCOUNTER — Encounter (HOSPITAL_BASED_OUTPATIENT_CLINIC_OR_DEPARTMENT_OTHER): Admitting: General Surgery

## 2024-12-11 DIAGNOSIS — L97822 Non-pressure chronic ulcer of other part of left lower leg with fat layer exposed: Secondary | ICD-10-CM | POA: Diagnosis not present

## 2024-12-20 ENCOUNTER — Encounter (HOSPITAL_BASED_OUTPATIENT_CLINIC_OR_DEPARTMENT_OTHER): Attending: General Surgery | Admitting: General Surgery

## 2024-12-20 ENCOUNTER — Other Ambulatory Visit: Payer: Self-pay | Admitting: Cardiovascular Disease

## 2024-12-20 DIAGNOSIS — I129 Hypertensive chronic kidney disease with stage 1 through stage 4 chronic kidney disease, or unspecified chronic kidney disease: Secondary | ICD-10-CM | POA: Diagnosis not present

## 2024-12-20 DIAGNOSIS — L97822 Non-pressure chronic ulcer of other part of left lower leg with fat layer exposed: Secondary | ICD-10-CM | POA: Diagnosis present

## 2024-12-20 DIAGNOSIS — Z87891 Personal history of nicotine dependence: Secondary | ICD-10-CM | POA: Insufficient documentation

## 2024-12-20 DIAGNOSIS — N1832 Chronic kidney disease, stage 3b: Secondary | ICD-10-CM | POA: Diagnosis not present

## 2024-12-20 DIAGNOSIS — R7303 Prediabetes: Secondary | ICD-10-CM | POA: Insufficient documentation

## 2024-12-27 ENCOUNTER — Encounter (HOSPITAL_BASED_OUTPATIENT_CLINIC_OR_DEPARTMENT_OTHER): Admitting: General Surgery

## 2024-12-27 DIAGNOSIS — L97822 Non-pressure chronic ulcer of other part of left lower leg with fat layer exposed: Secondary | ICD-10-CM | POA: Diagnosis not present

## 2025-01-03 ENCOUNTER — Encounter (HOSPITAL_BASED_OUTPATIENT_CLINIC_OR_DEPARTMENT_OTHER): Admitting: General Surgery

## 2025-01-03 DIAGNOSIS — L97822 Non-pressure chronic ulcer of other part of left lower leg with fat layer exposed: Secondary | ICD-10-CM | POA: Diagnosis not present

## 2025-01-10 ENCOUNTER — Encounter (HOSPITAL_BASED_OUTPATIENT_CLINIC_OR_DEPARTMENT_OTHER): Admitting: General Surgery

## 2025-01-23 ENCOUNTER — Ambulatory Visit (HOSPITAL_COMMUNITY)

## 2025-02-12 ENCOUNTER — Ambulatory Visit (HOSPITAL_COMMUNITY)

## 2025-02-12 ENCOUNTER — Ambulatory Visit: Admitting: Cardiovascular Disease
